# Patient Record
Sex: Female | Born: 2000
Health system: Southern US, Community
[De-identification: ages and names within clinical notes are randomized; demographics above are authoritative.]

## PROBLEM LIST (undated history)

## (undated) DIAGNOSIS — L309 Dermatitis, unspecified: Secondary | ICD-10-CM

## (undated) DIAGNOSIS — F988 Other specified behavioral and emotional disorders with onset usually occurring in childhood and adolescence: Secondary | ICD-10-CM

## (undated) DIAGNOSIS — Z91018 Allergy to other foods: Secondary | ICD-10-CM

## (undated) DIAGNOSIS — H1010 Acute atopic conjunctivitis, unspecified eye: Secondary | ICD-10-CM

## (undated) DIAGNOSIS — J45909 Unspecified asthma, uncomplicated: Secondary | ICD-10-CM

## (undated) DIAGNOSIS — J309 Allergic rhinitis, unspecified: Secondary | ICD-10-CM

## (undated) HISTORY — DX: Other specified behavioral and emotional disorders with onset usually occurring in childhood and adolescence: F98.8

## (undated) HISTORY — DX: Acute atopic conjunctivitis, unspecified eye: H10.10

## (undated) HISTORY — PX: NO PAST SURGERIES: SHX2092

## (undated) HISTORY — DX: Dermatitis, unspecified: L30.9

## (undated) HISTORY — DX: Allergy to other foods: Z91.018

## (undated) HISTORY — DX: Acute atopic conjunctivitis, unspecified eye: J30.9

---
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
## 2018-01-15 ENCOUNTER — Other Ambulatory Visit: Payer: Self-pay | Admitting: Physician Assistant

## 2018-03-20 ENCOUNTER — Other Ambulatory Visit: Payer: Self-pay | Admitting: Physician Assistant

## 2018-03-20 NOTE — Telephone Encounter (Signed)
Last seen 11/15/17

## 2018-03-24 ENCOUNTER — Emergency Department (HOSPITAL_COMMUNITY)
Admission: EM | Admit: 2018-03-24 | Discharge: 2018-03-24 | Disposition: A | Discharge status: Home / Self Care | Payer: No Typology Code available for payment source | Attending: Emergency Medicine | Admitting: Emergency Medicine

## 2018-03-24 ENCOUNTER — Encounter (HOSPITAL_COMMUNITY): Payer: Self-pay | Admitting: Emergency Medicine

## 2018-03-24 DIAGNOSIS — R21 Rash and other nonspecific skin eruption: Secondary | ICD-10-CM | POA: Insufficient documentation

## 2018-03-24 DIAGNOSIS — J45909 Unspecified asthma, uncomplicated: Secondary | ICD-10-CM | POA: Diagnosis not present

## 2018-03-24 DIAGNOSIS — Z91010 Allergy to peanuts: Secondary | ICD-10-CM | POA: Insufficient documentation

## 2018-03-24 DIAGNOSIS — Z79899 Other long term (current) drug therapy: Secondary | ICD-10-CM | POA: Insufficient documentation

## 2018-03-24 LAB — COMPREHENSIVE METABOLIC PANEL
ALT: 19 U/L (ref 0–44)
AST: 24 U/L (ref 15–41)
Albumin: 3.8 g/dL (ref 3.5–5.0)
Alkaline Phosphatase: 67 U/L (ref 38–126)
Anion gap: 10 (ref 5–15)
BUN: 8 mg/dL (ref 6–20)
CO2: 22 mmol/L (ref 22–32)
Calcium: 9.3 mg/dL (ref 8.9–10.3)
Chloride: 108 mmol/L (ref 98–111)
Creatinine, Ser: 0.7 mg/dL (ref 0.44–1.00)
GFR calc Af Amer: >60 mL/min (ref >60)
GFR calc non Af Amer: >60 mL/min (ref >60)
Glucose, Bld: 124 mg/dL — ABNORMAL HIGH (ref 70–99)
Potassium: 3.7 mmol/L (ref 3.5–5.1)
Sodium: 140 mmol/L (ref 135–145)
Total Bilirubin: 0.3 mg/dL (ref 0.3–1.2)
Total Protein: 6.8 g/dL (ref 6.5–8.1)

## 2018-03-24 LAB — CBC
HCT: 42.1 % (ref 36.0–46.0)
Hemoglobin: 13.4 g/dL (ref 12.0–15.0)
MCH: 29.1 pg (ref 26.0–34.0)
MCHC: 31.8 g/dL (ref 30.0–36.0)
MCV: 91.5 fL (ref 78.0–100.0)
Platelets: 396 10*3/uL (ref 150–400)
RBC: 4.6 MIL/uL (ref 3.87–5.11)
RDW: 13.1 % (ref 11.5–15.5)
WBC: 11.7 10*3/uL — ABNORMAL HIGH (ref 4.0–10.5)

## 2018-03-24 LAB — I-STAT CG4 LACTIC ACID, ED
Lactic Acid, Venous: 3.1 mmol/L (ref 0.5–1.9)
Lactic Acid, Venous: 1.81 mmol/L (ref 0.5–1.9)

## 2018-03-24 MED ORDER — PREDNISONE 20 MG PO TABS: 60.0000 mg | ORAL_TABLET | Freq: Every day | ORAL | 0 refills | Status: DC

## 2018-03-24 MED ORDER — PREDNISONE 20 MG PO TABS: 60.0000 mg | ORAL_TABLET | Freq: Every day | ORAL | 0 refills | Status: AC

## 2018-03-24 MED ORDER — DIPHENHYDRAMINE HCL 25 MG PO TABS: 25.0000 mg | ORAL_TABLET | Freq: Three times a day (TID) | ORAL | 0 refills | Status: DC | PRN

## 2018-03-24 MED ORDER — FAMOTIDINE IN NACL 20-0.9 MG/50ML-% IV SOLN
20.0000 mg | Freq: Once | INTRAVENOUS | Status: AC
  Administered 2018-03-24: 20 mg via INTRAVENOUS
  Filled 2018-03-24: qty 50

## 2018-03-24 MED ORDER — FAMOTIDINE 20 MG PO TABS: 20.0000 mg | ORAL_TABLET | Freq: Two times a day (BID) | ORAL | 0 refills | Status: DC

## 2018-03-24 MED ORDER — METHYLPREDNISOLONE SODIUM SUCC 125 MG IJ SOLR
125.0000 mg | Freq: Once | INTRAMUSCULAR | Status: AC
  Administered 2018-03-24: 125 mg via INTRAVENOUS
  Filled 2018-03-24: qty 2

## 2018-03-24 MED ORDER — SODIUM CHLORIDE 0.9 % IV BOLUS
1000.0000 mL | Freq: Once | INTRAVENOUS | Status: AC
Start: 2018-03-24 — End: 2018-03-24
  Administered 2018-03-24: 1000 mL via INTRAVENOUS

## 2018-03-24 MED ORDER — DIPHENHYDRAMINE HCL 50 MG/ML IJ SOLN
50.0000 mg | Freq: Once | INTRAMUSCULAR | Status: AC
  Administered 2018-03-24: 50 mg via INTRAVENOUS
  Filled 2018-03-24: qty 1

## 2018-03-24 NOTE — Discharge Instructions (Addendum)
Take Prednisone 60mg  daily for 7 days. Also take the Benadryl and Pepcid three times a day while you are on the steroid.   Follow-up with your allergist and/or PCP ASAP.

## 2018-03-24 NOTE — ED Notes (Signed)
Pt made this RN aware [facility]  Will order blood.

## 2018-03-24 NOTE — ED Provider Notes (Signed)
MSE was initiated and I personally evaluated the patient and placed orders (if any) at  6:17 PM on March 24, 2018.  The patient appears stable so that the remainder of the MSE may be completed by another provider.  Patient placed in Quick Look pathway, seen and evaluated   Chief Complaint: rash  HPI:   Patient presents to ED for evaluation of 1-1/2-week history of worsening diffuse rash to body.  She went to the beach for vacation before symptoms began.  States that she laid out in the sun but does not feel that this is related to sunburn.  The only thing different that she can think of that she may be exposed to was ocean water.  She does not usually go into the ocean.  She has a history of eczema but states that has never been this bad or diffuse.  States that the rash is extremely itchy.  She denies fevers.  No sick contacts with similar symptoms.  She is not taking any medicine help with her symptoms.  ROS: Rash  Physical Exam:   Gen: No distress  Neuro: Awake and Alert  Skin: Warm    Focused Exam: Diffuse erythematous, scaly and excoriated rash noted to bilateral upper and lower extremities.  Tachycardic.   Initiation of care has begun. The patient has been counseled on the process, plan, and necessity for staying for the completion/evaluation, and the remainder of the medical screening examination    Dietrich PatesKhatri, Jurnei Latini, PA-C 03/24/18 1819    Gerhard MunchLockwood, Robert, MD 03/25/18 1327

## 2018-03-24 NOTE — ED Provider Notes (Addendum)
[facility] EMERGENCY DEPARTMENT Provider Note  CSN: [id number] Arrival date & time: 03/24/18  1626  History   Chief Complaint Chief Complaint  Patient presents with  . Rash    HPI Casey Weaver is a 17 y.o. female with a medical history of numerous allergies, asthma and eczema who presented to the ED for rash x1.5 weeks. Patient describes a generalized, red, itchy and flaky rash that began while she was on vacation at the beach. She endorses getting in the ocean for the first time and that the rash appeared the next day. Rash is everywhere except her genitalia. She reports using Kenalog cream for the rash with minimal relief. Denies fever, chest pain, SOB, wheezing, difficulty breathing and upper respiratory symptoms. Denies recent sick contacts. She is up to date with vaccines. Denies neck pain, stiffness, AMS/confusion.  Additional history obtained by patient's mother. She states that patient has over 70 allergies and is hypersensitive to exposures. She reports patient being managed by an allergist and is on multiple creams, inhalants and pills for her allergies, asthmas and eczema.  Past Medical History:  Diagnosis Date  . Allergic rhinoconjunctivitis   . Asthma   . Attention deficit disorder (ADD)   . Eczema   . Food allergy     Patient Active Problem List   Diagnosis Date Noted  . Gastroesophageal reflux disease without esophagitis 06/16/2017  . Eczema 11/06/2016  . Chronic asthma 06/30/2016  . Intrinsic eczema 06/30/2016  . Attention deficit hyperactivity disorder (ADHD), predominantly inattentive type 06/30/2016    Past Surgical History:  Procedure Laterality Date  . NO PAST SURGERIES       OB History   None      Home Medications    Prior to Admission medications   Medication Sig Start Date End Date Taking? Authorizing Provider  albuterol (PROVENTIL) (2.5 MG/3ML) 0.083% nebulizer solution INHALE 1 VIAL VIA NEBULIZER EVERY 6 HOURS 10/04/17  Yes  Remus Loffler, PA-C  budesonide-formoterol (SYMBICORT) 160-4.5 MCG/ACT inhaler Inhale 2 puffs into the lungs 2 (two) times daily. 11/15/17  Yes Remus Loffler, PA-C  ibuprofen (ADVIL,MOTRIN) 200 MG tablet Take 200 mg by mouth every 6 (six) hours as needed for pain.   Yes [provider]  levocetirizine (XYZAL) 5 MG tablet TAKE 1 TABLET BY MOUTH ONCE EVERY EVENING 03/20/18  Yes Prudy Feeler S, PA-C  montelukast (SINGULAIR) 10 MG tablet TAKE 1 TABLET EVERY EVENING 03/20/18  Yes Remus Loffler, PA-C  VENTOLIN HFA 108 (90 Base) MCG/ACT inhaler INHALE 2 PUFFS EVERY 4 TO 6 HOURS AS NEEDED FOR COUGH AND WHEEZING 01/15/18  Yes Remus Loffler, PA-C  ALPRAZolam Prudy Feeler) 0.5 MG tablet Take 1 tablet (0.5 mg total) by mouth at bedtime as needed for anxiety. Patient not taking: Reported on 03/24/2018 12/21/17   Remus Loffler, PA-C  cyclobenzaprine (FLEXERIL) 10 MG tablet Take 1 tablet (10 mg total) by mouth 2 (two) times daily as needed for muscle spasms. Patient not taking: Reported on 03/24/2018 11/14/17   Maxwell Caul, PA-C  diphenhydrAMINE (BENADRYL) 25 MG tablet Take 1 tablet (25 mg total) by mouth every 8 (eight) hours as needed for up to 7 days for itching. 03/24/18 03/31/18  Mortis, Jerrel Ivory I, PA-C  EUCRISA 2 % OINT APPLY TOPICALLY TWICE A DAY Patient not taking: Reported on 03/24/2018 10/01/17   Remus Loffler, PA-C  famotidine (PEPCID) 20 MG tablet Take 1 tablet (20 mg total) by mouth 2 (two) times daily  for 7 days. 03/24/18 03/31/18  Mortis, Jerrel IvoryGabrielle I, PA-C  halobetasol (ULTRAVATE) 0.05 % cream Apply topically 2 (two) times daily. Patient not taking: Reported on 03/24/2018 05/29/16   Remus LofflerJones, Angel S, PA-C  omeprazole (PRILOSEC) 20 MG capsule Take 1 capsule (20 mg total) by mouth daily. Patient not taking: Reported on 03/24/2018 11/15/17   Remus LofflerJones, Angel S, PA-C  pimecrolimus (ELIDEL) 1 % cream Apply topically 2 (two) times daily. Patient not taking: Reported on 03/24/2018 05/01/17   Remus LofflerJones, Angel S, PA-C    predniSONE (DELTASONE) 20 MG tablet Take 3 tablets (60 mg total) by mouth daily for 7 doses. 03/24/18 03/31/18  Mortis, Jerrel IvoryGabrielle I, PA-C  triamcinolone cream (KENALOG) 0.1 % APPLY TO AFFECTED AREA TWICE A DAY AS DIRECTED Patient not taking: Reported on 03/24/2018 02/15/17   Remus LofflerJones, Angel S, PA-C    Family History Family History  Problem Relation Age of Onset  . Eczema Mother   . Allergic rhinitis Mother   . Angioedema Neg Hx   . Asthma Neg Hx   . Immunodeficiency Neg Hx   . Urticaria Neg Hx     Social History Social History   Tobacco Use  . Smoking status: Never Smoker  . Smokeless tobacco: Never Used  Substance Use Topics  . Alcohol use: No  . Drug use: No     Allergies   Peanut-containing drug products and Septra [sulfamethoxazole-trimethoprim]   Review of Systems Review of Systems  Constitutional: Negative for activity change, appetite change, chills and fever.  HENT: Negative for congestion, ear pain, postnasal drip, rhinorrhea and sore throat.   Eyes: Negative for pain and visual disturbance.  Respiratory: Negative for cough, choking, chest tightness, shortness of breath and wheezing.   Cardiovascular: Negative for chest pain, palpitations and leg swelling.  Gastrointestinal: Negative.   Genitourinary: Negative.   Musculoskeletal: Negative.   Skin: Positive for rash.  Allergic/Immunologic: Positive for environmental allergies and food allergies.     Physical Exam Updated Vital Signs BP 108/65   Pulse (!) 101   Temp 98.6 F (37 C) (Oral)   Resp (!) 21   LMP 03/20/2018   SpO2 98%   Physical Exam  Constitutional: She appears well-developed and well-nourished.  Obese. Laying comfortably on bed.  Eyes: Pupils are equal, round, and reactive to light. Conjunctivae, EOM and lids are normal.  Neck: Normal range of motion and full passive range of motion without pain. Neck supple. No neck rigidity. Normal range of motion present. No Brudzinski's sign and no  Kernig's sign noted.  Cardiovascular: Normal rate, regular rhythm, normal heart sounds and intact distal pulses.  Pulses:      Dorsalis pedis pulses are 2+ on the right side, and 2+ on the left side.       Posterior tibial pulses are 2+ on the right side, and 2+ on the left side.  Pulmonary/Chest: Effort normal and breath sounds normal. No respiratory distress. She has no wheezes.  Abdominal: Soft. Normal appearance and bowel sounds are normal. There is no tenderness.  Musculoskeletal: Normal range of motion.  Neurological: She has normal strength. No cranial nerve deficit or sensory deficit. She exhibits normal muscle tone.  Skin: Skin is dry. Capillary refill takes less than 2 seconds. Rash noted. Rash is macular. She is not diaphoretic. There is erythema. No pallor.  Generalized erythematous fine macular rash with scaling on bilateral upper and lower extremities, trunk, back and face. Spares buttocks and genitalia. Eczematous plaques with excoriations on dorsal ankles and  antecubital creases bilaterally. No vesicles, blisters, ulcers or areas desquamation. No active draining or bleeding sites.  Nursing note and vitals reviewed.         ED Treatments / Results  Labs (all labs ordered are listed, but only abnormal results are displayed) Labs Reviewed  CBC - Abnormal; Notable for the following components:      Result Value   WBC 11.7 (*)    All other components within normal limits  COMPREHENSIVE METABOLIC PANEL - Abnormal; Notable for the following components:   Glucose, Bld 124 (*)    All other components within normal limits  I-STAT CG4 LACTIC ACID, ED - Abnormal; Notable for the following components:   Lactic Acid, Venous 3.10 (*)    All other components within normal limits  GROUP A STREP BY PCR  CULTURE, BLOOD (ROUTINE X 2)  CULTURE, BLOOD (ROUTINE X 2)  URINALYSIS, ROUTINE W REFLEX MICROSCOPIC  I-STAT CG4 LACTIC ACID, ED   EKG None  Radiology No results  found.  Procedures Procedures (including critical care time)  Medications Ordered in ED Medications  sodium chloride 0.9 % bolus 1,000 mL (0 mLs Intravenous Stopped 03/24/18 2007)  methylPREDNISolone sodium succinate (SOLU-MEDROL) 125 mg/2 mL injection 125 mg (125 mg Intravenous Given 03/24/18 1935)  diphenhydrAMINE (BENADRYL) injection 50 mg (50 mg Intravenous Given 03/24/18 1935)  famotidine (PEPCID) IVPB 20 mg premix (0 mg Intravenous Stopped 03/24/18 2007)     Initial Impression / Assessment and Plan / ED Course  Triage vital signs and the nursing notes have been reviewed.  Pertinent labs & imaging results that were available during care of the patient were reviewed and considered in medical decision making (see chart for details).  Patient presents with normal vital signs and is relatively well appearing. A generalized head to toe erythematous macular scaly rash is appreciated. Patient is not scratching on exam. The rash is uniform and consistent throughout the body. It is reassuring that there are no vesicles, blisters, ulcers or areas of desquamation to suggest SJS or TEN. Also, given that rash has been stable for over a week without any constitutional symptoms is also reassuring to rule out an emergent bacterial skin condition like necrotizing fasciitis. No s/s of anaphylaxis or respiratory compromise.   Clinical Course as of Mar 24 2202  Wynelle Link Mar 24, 2018  1610 Initial lactic acid elevated > 3. Blood cultures ordered. Will repeat level.   [GM]  1908 IV Solumedrol, Benadryl and Pepcid ordered.   [GM]  2201 Repeat lactic acid back to normal at 1.81. Re-evaluated patient after IV therapies. Resolution in pruritus achieved. Also patient's face is less erythematous and rashes appear less swollen. Case discussed with Dr. Gerhard Munch about discharge vs observation. He agrees that patient is stable enough for discharge.   [GM]    Clinical Course User Index [GM] Mortis, Sharyon Medicus, PA-C    Given patient's hypersensitivity and extensive allergy history, it is reasonable to think that rash is just an allergic reaction. Patient re-evaluated after IV medications given. She reports decreased pruritus and face is less erythematous and other areas less swollen as well. Given lactic acid has dropped and has reported compliance with other medications and close follow-up with allergist, patient is stable enough for discharge today. Thorough education provided to patient and family on medications prescribed and follow-up instructions.  Final Clinical Impressions(s) / ED Diagnoses  1. Rash. Hypersensitivity reaction. Unknown allergen at this time. Prednisone 60mg  x7 days, Benadryl 25mg  TID and Pepcid 20mg   BID prescribed to assist with rash resolution and symptom relief. Advised to follow-up with allergist ASAP.  Dispo: Home. After thorough clinical evaluation, this patient is determined to be medically stable and can be safely discharged with the previously mentioned treatment and/or outpatient follow-up/referral(s). At this time, there are no other apparent medical conditions that require further screening, evaluation or treatment.  Final diagnoses:  Rash   ED Discharge Orders        Ordered     03/24/18 2100    diphenhydrAMINE (BENADRYL) 25 MG tablet  Every 8 hours PRN     03/24/18 2100       03/24/18 2102    predniSONE (DELTASONE) 20 MG tablet  Daily     03/24/18 2102        Dagoberto Ligas I, PA-C 03/24/18 2200    Dagoberto Ligas I, PA-C 03/24/18 2205    Gerhard Munch, MD 03/25/18 1326

## 2018-03-24 NOTE — ED Triage Notes (Signed)
Pt presents to ED for assessment of entire body rash, worsening x 1.5 weeks while patient was on vacation.  Pt c/o of her skin feeling hot, hx of eczema.  States skin is sensitive, painful, and diffuse rash noted.

## 2018-03-25 LAB — GROUP A STREP BY PCR: Group A Strep by PCR: NOT DETECTED

## 2018-03-29 LAB — CULTURE, BLOOD (ROUTINE X 2)
Culture: NO GROWTH
Culture: NO GROWTH
Special Requests: ADEQUATE
Special Requests: ADEQUATE

## 2018-04-22 ENCOUNTER — Ambulatory Visit (INDEPENDENT_AMBULATORY_CARE_PROVIDER_SITE_OTHER): Payer: No Typology Code available for payment source | Admitting: Physician Assistant

## 2018-04-22 ENCOUNTER — Encounter: Payer: Self-pay | Admitting: Physician Assistant

## 2018-04-22 VITALS — BP 140/93 | HR 89 | Temp 97.4°F | Ht 60.02 in | Wt 196.4 lb

## 2018-04-22 DIAGNOSIS — F321 Major depressive disorder, single episode, moderate: Secondary | ICD-10-CM | POA: Insufficient documentation

## 2018-04-22 MED ORDER — ESCITALOPRAM OXALATE 10 MG PO TABS: 10.0000 mg | ORAL_TABLET | Freq: Every day | ORAL | 1 refills | Status: DC

## 2018-04-22 NOTE — Patient Instructions (Signed)
Your provider wants you to schedule an appointment with a Psychologist/Psychiatrist. The following list of offices requires the patient to call and make their own appointment, as there is information they need that only you can provide. Please feel free to choose form the following providers:  [facility]   [phone] Crisis [place] [phone]  [facility]  [phone]   [address] [place], [place]  (Scheduled through [facility]) Must call and do an interview for appointment. Sees Children / Accepts Medicaid  [facility]    [phone]  [address], [address]    [place], [place]       [facility]  [phone] [address] [place], [place]  Evaluates for Autism but does not treat it Sees Children / Accepts Medicaid  [facility]    [phone] [address], [address]   [place], [place] Medication management, substance abuse, bipolar, grief, family, marriage, OCD, anxiety, PTSD Sees children / Accepts Medicaid  [facility]    [phone] [address] [place], [place] Sees children / [facility]s [facility]  [facility]  [phone] [address] [place], [place]   Dr Estelle GrumblesAkinlayo     [phone] [address] [place]  Sees ADD & ADHD for treatment Accepts Medicaid  [facility]  [phone] [place], [place] Evaluates for Autism [facility]s [facility]  [facility]  [phone] [address] [place], [place]  Does Adult ADD evaluations Does not accept Medicaid  [facility]   [phone] [address]   [place], [place] Uses animal therapy  Sees children as young as 19 years old Accepts [facility]  [facility]     [phone]    [address]  [place], [place] [phone] Sees children Accepts Medicaid  JULIA Alvan DameBRANNON
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
## 2018-04-30 ENCOUNTER — Telehealth: Payer: Self-pay | Admitting: Physician Assistant

## 2018-04-30 NOTE — Telephone Encounter (Signed)
What symptoms do you have? Swelling in her legs  How long have you been sick? Has been off in and on since going to ER in July, she is going to the bathroom was told it was reaction since she was on vacation and in different surrounding  Have you been seen for this problem? Yes, when she elevates her legs the swelling goes down  If your provider decides to give you a prescription, which pharmacy would you like for it to be sent to? [facility]   Patient informed that this information will be sent to the clinical staff for review and that they should receive a follow up call.

## 2018-05-01 ENCOUNTER — Encounter: Payer: Self-pay | Admitting: Family Medicine

## 2018-05-01 ENCOUNTER — Ambulatory Visit (INDEPENDENT_AMBULATORY_CARE_PROVIDER_SITE_OTHER): Payer: No Typology Code available for payment source | Admitting: Family Medicine

## 2018-05-01 VITALS — BP 159/76 | HR 97 | Temp 98.1°F | Ht 60.02 in | Wt 200.2 lb

## 2018-05-01 DIAGNOSIS — L2084 Intrinsic (allergic) eczema: Secondary | ICD-10-CM

## 2018-05-01 MED ORDER — METHYLPREDNISOLONE ACETATE 80 MG/ML IJ SUSP
80.0000 mg | Freq: Once | INTRAMUSCULAR | Status: AC
  Administered 2018-05-01: 80 mg via INTRAMUSCULAR

## 2018-05-01 MED ORDER — TRIAMCINOLONE ACETONIDE 0.1 % EX CREA: TOPICAL_CREAM | Freq: Two times a day (BID) | CUTANEOUS | 2 refills | Status: DC

## 2018-05-01 MED ORDER — PREDNISONE 20 MG PO TABS: ORAL_TABLET | ORAL | 0 refills | Status: DC

## 2018-05-01 NOTE — Telephone Encounter (Signed)
Apt today.

## 2018-05-01 NOTE — Progress Notes (Signed)
BP (!) 159/76   Pulse 97   Temp 98.1 F (36.7 C) (Oral)   Ht 5' 0.02" (1.525 m)   Wt 200 lb 3.2 oz (90.8 kg)   BMI 39.07 kg/m    Subjective:    Patient ID: Casey Weaver, female    DOB: 2001/04/04, 17 y.o.   MRN: [id number]  HPI: Casey Pedlesa C Nuckles is a 17 y.o. female presenting on 05/01/2018 for Leg Swelling (bilateral x 1 month on and off- Patient was seen [facility] ER 7/7) and Arm Swelling (bilateral on and off)   HPI Swelling and rash Patient comes in complaining of swelling and rash is been going on for 1 month off and on.  She was seen in the ER and having 03/2018 and they saw that she had a severe eczema and rash and gave her some steroids which almost completely resolved what she had but then it came back about a week later it is been coming on and off.  She is continued to use her allergy medications including Singulair and Xyzal and triamcinolone and CeraVe cream.  She says that these are just not cutting it for her.  She says this all started [place].  There is no sign of redness or warmth but just the rash looks like her usual eczema on both of her arms and some on her face and neck and some on her legs and feet as well.  She has swelling and redness in her arms and legs where the rash is.  Relevant past medical, surgical, family and social history reviewed and updated as indicated. Interim medical history since our last visit reviewed. Allergies and medications reviewed and updated.  Review of Systems  Constitutional: Negative for chills and fever.  Eyes: Negative for visual disturbance.  Respiratory: Negative for cough, chest tightness, shortness of breath and wheezing.   Cardiovascular: Positive for leg swelling. Negative for chest pain.  Genitourinary: Negative for difficulty urinating and dysuria.  Musculoskeletal: Negative for back pain and gait problem.  Skin: Positive for color change and rash.  Neurological: Negative for light-headedness and  headaches.  Psychiatric/Behavioral: Negative for agitation and behavioral problems.  All other systems reviewed and are negative.   Per HPI unless specifically indicated above   Allergies as of 05/01/2018      Reactions   Peanut-containing Drug Products Swelling   PEANUT BUTTER: throat swells   Septra [sulfamethoxazole-trimethoprim] Rash      Medication List        Accurate as of 05/01/18  2:07 PM. Always use your most recent med list.          albuterol (2.5 MG/3ML) 0.083% nebulizer solution Commonly known as:  PROVENTIL INHALE 1 VIAL VIA NEBULIZER EVERY 6 HOURS   VENTOLIN HFA 108 (90 Base) MCG/ACT inhaler Generic drug:  albuterol INHALE 2 PUFFS EVERY 4 TO 6 HOURS AS NEEDED FOR COUGH AND WHEEZING   ALPRAZolam 0.5 MG tablet Commonly known as:  XANAX Take 1 tablet (0.5 mg total) by mouth at bedtime as needed for anxiety.   budesonide-formoterol 160-4.5 MCG/ACT inhaler Commonly known as:  SYMBICORT Inhale 2 puffs into the lungs 2 (two) times daily.   cyclobenzaprine 10 MG tablet Commonly known as:  FLEXERIL Take 1 tablet (10 mg total) by mouth 2 (two) times daily as needed for muscle spasms.   diphenhydrAMINE 25 MG tablet Commonly known as:  BENADRYL Take 1 tablet (25 mg total) by mouth every 8 (  eight) hours as needed for up to 7 days for itching.   escitalopram 10 MG tablet Commonly known as:  LEXAPRO Take 1 tablet (10 mg total) by mouth daily.   EUCRISA 2 % Oint Generic drug:  Crisaborole APPLY TOPICALLY TWICE A DAY   famotidine 20 MG tablet Commonly known as:  PEPCID Take 1 tablet (20 mg total) by mouth 2 (two) times daily for 7 days.   halobetasol 0.05 % cream Commonly known as:  ULTRAVATE Apply topically 2 (two) times daily.   ibuprofen 200 MG tablet Commonly known as:  ADVIL,MOTRIN Take 200 mg by mouth every 6 (six) hours as needed for pain.   levocetirizine 5 MG tablet Commonly known as:  XYZAL TAKE 1 TABLET BY MOUTH ONCE EVERY EVENING     montelukast 10 MG tablet Commonly known as:  SINGULAIR TAKE 1 TABLET EVERY EVENING   omeprazole 20 MG capsule Commonly known as:  PRILOSEC Take 1 capsule (20 mg total) by mouth daily.   pimecrolimus 1 % cream Commonly known as:  ELIDEL Apply topically 2 (two) times daily.   predniSONE 20 MG tablet Commonly known as:  DELTASONE 2 po at same time daily for 5 days   triamcinolone cream 0.1 % Commonly known as:  KENALOG Apply topically 2 (two) times daily.          Objective:    BP (!) 159/76   Pulse 97   Temp 98.1 F (36.7 C) (Oral)   Ht 5' 0.02" (1.525 m)   Wt 200 lb 3.2 oz (90.8 kg)   BMI 39.07 kg/m   Wt Readings from Last 3 Encounters:  05/01/18 200 lb 3.2 oz (90.8 kg) (97 %, Z= 1.95)*  04/22/18 196 lb 6.4 oz (89.1 kg) (97 %, Z= 1.90)*  11/15/17 197 lb 9.6 oz (89.6 kg) (97 %, Z= 1.93)*   * Growth percentiles are based on CDC (Girls, 2-20 Years) data.    Physical Exam  Constitutional: She is oriented to person, place, and time. She appears well-developed and well-nourished. No distress.  Eyes: Conjunctivae are normal.  Musculoskeletal: Normal range of motion. She exhibits no edema.  Neurological: She is alert and oriented to person, place, and time. Coordination normal.  Skin: Skin is warm and dry. Rash noted. Rash is maculopapular (Maculopapular rash with excoriations on both upper arms and on her lower legs, none on her palms or soles of her feet or in her groin.  She has some around her neck and a little bit on her face above her eye and on her cheeks.). She is not diaphoretic.  Psychiatric: She has a normal mood and affect. Her behavior is normal.  Nursing note and vitals reviewed.  She has swelling in her arms from the inflammation from the rash and slight swelling in her legs from the same.     Assessment & Plan:   Problem List Items Addressed This Visit      Musculoskeletal and Integument   Intrinsic eczema - Primary   Relevant Medications    triamcinolone cream (KENALOG) 0.1 %   predniSONE (DELTASONE) 20 MG tablet   methylPREDNISolone acetate (DEPO-MEDROL) injection 80 mg (Start on 05/01/2018  2:15 PM)      Give more triamcinolone, recommended wet-to-dry clothing at night and cool showers and another round of prednisone with double shot. Follow up plan: Return if symptoms worsen or fail to improve.  Counseling provided for all of the vaccine components No orders of the defined types were placed in  this encounter.   Arville Care, MD [facility] 05/01/2018, 2:07 PM

## 2018-05-01 NOTE — Telephone Encounter (Signed)
Attempted to contact - NA ° °NTBS °

## 2018-05-07 ENCOUNTER — Encounter: Payer: Self-pay | Admitting: Allergy and Immunology

## 2018-05-07 ENCOUNTER — Telehealth: Payer: Self-pay | Admitting: *Deleted

## 2018-05-07 ENCOUNTER — Ambulatory Visit (INDEPENDENT_AMBULATORY_CARE_PROVIDER_SITE_OTHER): Payer: No Typology Code available for payment source | Admitting: Allergy and Immunology

## 2018-05-07 VITALS — BP 116/60 | HR 88 | Resp 16 | Ht 60.0 in | Wt 199.0 lb

## 2018-05-07 DIAGNOSIS — D721 Eosinophilia, unspecified: Secondary | ICD-10-CM

## 2018-05-07 DIAGNOSIS — J309 Allergic rhinitis, unspecified: Secondary | ICD-10-CM | POA: Diagnosis not present

## 2018-05-07 DIAGNOSIS — T7800XD Anaphylactic reaction due to unspecified food, subsequent encounter: Secondary | ICD-10-CM | POA: Diagnosis not present

## 2018-05-07 DIAGNOSIS — J4540 Moderate persistent asthma, uncomplicated: Secondary | ICD-10-CM

## 2018-05-07 DIAGNOSIS — L2089 Other atopic dermatitis: Secondary | ICD-10-CM | POA: Diagnosis not present

## 2018-05-07 DIAGNOSIS — H1010 Acute atopic conjunctivitis, unspecified eye: Secondary | ICD-10-CM

## 2018-05-07 MED ORDER — EPINEPHRINE 0.3 MG/0.3ML IJ SOAJ: INTRAMUSCULAR | 1 refills | Status: DC

## 2018-05-07 MED ORDER — AMOXICILLIN-POT CLAVULANATE 875-125 MG PO TABS: 1.0000 | ORAL_TABLET | Freq: Two times a day (BID) | ORAL | 0 refills | Status: AC

## 2018-05-07 MED ORDER — TACROLIMUS 0.1 % EX OINT: TOPICAL_OINTMENT | Freq: Two times a day (BID) | CUTANEOUS | 2 refills | Status: DC

## 2018-05-07 NOTE — Telephone Encounter (Signed)
Patient started Dupixent today please submit per Dr Lucie LeatherKozlow

## 2018-05-07 NOTE — Patient Instructions (Addendum)
  1.  Treat and prevent skin inflammation:   A.  Triamcinolone 0.1% cream + Protopic 0.1% ointment twice a day  B.  Finish current dosing of prednisone  C.  Dupilumab administered in clinic today and to be utilized every 2 weeks  2.  Treat and prevent respiratory inflammation:   A.  Symbicort 160 - 2 inhalations 1-2 times per day  3.  Treat infection:   A.  Augmentin 875 - 1 tablet twice a day for 10 days  4.  If needed:   A.  EpiPen/Auvi-Q  B.  OTC antihistamine -Zyrtec/Claritin/Allegra  C.  Ventolin HFA  5.  Return to clinic in 2 weeks or earlier if problem

## 2018-05-07 NOTE — Progress Notes (Addendum)
Immunotherapy   Patient Details  Name: Casey Weaver MRN: [id number] Date of Birth: Feb 24, 2001  05/07/2018  Casey Weaver started injections for  Dupixent 600mg   Frequency:Every 2 weeks Epi-Pen:Presciption for [name] Q sent in  Consent signed and patient instructions given. Mother and patient instructed on how and where to administer shot mother demonstrated correct technique and had no difficulties  [name] 05/07/2018, 6:05 PM

## 2018-05-07 NOTE — Progress Notes (Signed)
Follow-up Note  Referring Provider: Remus LofflerJones, Angel S, PA-C Primary Provider: Caryl NeverJones, Angel S, PA-C Date of Office Visit: 05/07/2018  Subjective:   Casey Weaver (DOB: Sep 28, 2000) is a 17 y.o. female [facility] in re-evaluation of the following:  HPI: Casey Weaver presents to this clinic in evaluation of multiorgan atopic disease.  I have never seen her in this clinic.  Her last visit with our clinic was with Dr. Delorse LekPadgett for an initial evaluation of 06 September 2016.  She has very severe atopic dermatitis and has failed both the application of topical steroids including high potency topical steroids and the application of calcineurin inhibitors.  Most recently she has had 2 very severe flares of her atopic dermatitis one in July and one in August both requiring systemic steroids.  She developed very red skin with localized swelling and then oozing involving her antecubital fossa and popliteal fossa and lower extremity.  She is much better at this point in time on her second course of systemic steroids but still has very active eczema on a chronic basis.  She is just finishing up her course of systemic steroids for the next several days.  Her asthma is under pretty good control at this point time while using Symbicort just 1 time per day.  Her requirement for short acting bronchodilators 1 time per week.  She does not appear to have a significant problem with her upper airways.  She remains away from peanut and tree nuts at this point in time.  Allergies as of 05/07/2018      Reactions   Peanut-containing Drug Products Swelling   PEANUT BUTTER: throat swells   Septra [sulfamethoxazole-trimethoprim] Rash      Medication List      albuterol (2.5 MG/3ML) 0.083% nebulizer solution Commonly known as:  PROVENTIL INHALE 1 VIAL VIA NEBULIZER EVERY 6 HOURS   VENTOLIN HFA 108 (90 Base) MCG/ACT inhaler Generic drug:  albuterol INHALE 2 PUFFS EVERY 4 TO  6 HOURS AS NEEDED FOR COUGH AND WHEEZING   ALPRAZolam 0.5 MG tablet Commonly known as:  XANAX Take 1 tablet (0.5 mg total) by mouth at bedtime as needed for anxiety.   budesonide-formoterol 160-4.5 MCG/ACT inhaler Commonly known as:  SYMBICORT Inhale 2 puffs into the lungs 2 (two) times daily.   escitalopram 10 MG tablet Commonly known as:  LEXAPRO Take 1 tablet (10 mg total) by mouth daily.   EUCRISA 2 % Oint Generic drug:  Crisaborole APPLY TOPICALLY TWICE A DAY   famotidine 20 MG tablet Commonly known as:  PEPCID Take 1 tablet (20 mg total) by mouth 2 (two) times daily for 7 days.   halobetasol 0.05 % cream Commonly known as:  ULTRAVATE Apply topically 2 (two) times daily.   ibuprofen 200 MG tablet Commonly known as:  ADVIL,MOTRIN Take 200 mg by mouth every 6 (six) hours as needed for pain.   levocetirizine 5 MG tablet Commonly known as:  XYZAL TAKE 1 TABLET BY MOUTH ONCE EVERY EVENING   montelukast 10 MG tablet Commonly known as:  SINGULAIR TAKE 1 TABLET EVERY EVENING   omeprazole 20 MG capsule Commonly known as:  PRILOSEC Take 1 capsule (20 mg total) by mouth daily.   pimecrolimus 1 % cream Commonly known as:  ELIDEL Apply topically 2 (two) times daily.   predniSONE 20 MG tablet Commonly known as:  DELTASONE 2 po at same time daily for 5 days   triamcinolone cream  0.1 % Commonly known as:  KENALOG Apply topically 2 (two) times daily.       Past Medical History:  Diagnosis Date  . Allergic rhinoconjunctivitis   . Asthma   . Attention deficit disorder (ADD)   . Eczema   . Food allergy     Past Surgical History:  Procedure Laterality Date  . NO PAST SURGERIES      Review of systems negative except as noted in HPI / PMHx or noted below:  Review of Systems  Constitutional: Negative.   HENT: Negative.   Eyes: Negative.   Respiratory: Negative.   Cardiovascular: Negative.   Gastrointestinal: Negative.   Genitourinary: Negative.     Musculoskeletal: Negative.   Skin: Negative.   Neurological: Negative.   Endo/Heme/Allergies: Negative.   Psychiatric/Behavioral: Negative.      Objective:   Vitals:   05/07/18 1642  BP: 116/60  Pulse: 88  Resp: 16  SpO2: 97%   Height: 5' (152.4 cm)  Weight: 199 lb (90.3 kg)   Physical Exam  HENT:  Head: Normocephalic.  Right Ear: Tympanic membrane, external ear and ear canal normal.  Left Ear: Tympanic membrane, external ear and ear canal normal.  Nose: Nose normal. No mucosal edema or rhinorrhea.  Mouth/Throat: Uvula is midline, oropharynx is clear and moist and mucous membranes are normal. No oropharyngeal exudate.  Eyes: Conjunctivae are normal.  Neck: Trachea normal. No tracheal tenderness present. No tracheal deviation present. No thyromegaly present.  Cardiovascular: Normal rate, regular rhythm, S1 normal, S2 normal and normal heart sounds.  No murmur heard. Pulmonary/Chest: Breath sounds normal. No stridor. No respiratory distress. She has no wheezes. She has no rales.  Musculoskeletal: She exhibits no edema.  Lymphadenopathy:       Head (right side): No tonsillar adenopathy present.       Head (left side): No tonsillar adenopathy present.    She has no cervical adenopathy.  Neurological: She is alert.  Skin: Rash (Widespread lichenified erythematous plaques across arms especially antecubital fossa and legs especially popliteal fossa and posterior calf) noted. She is not diaphoretic. No erythema. Nails show no clubbing.    Diagnostics:    Spirometry was performed and demonstrated an FEV1 of 3.19 at 108 % of predicted.  The patient had an Asthma Control Test with the following results: ACT Total Score: 20.    Results of blood tests obtained 15 November 2017 identified WBC 8.6, absolute eosinophil 1000, absolute lymphocyte 1700, hemoglobin 12.9, platelet 349  Assessment and Plan:   1. Other atopic dermatitis   2. Asthma, moderate persistent, well-controlled    3. Allergic rhinoconjunctivitis   4. Anaphylactic shock due to food, subsequent encounter   5. Eosinophilia     1.  Treat and prevent skin inflammation:   A.  Triamcinolone 0.1% cream + Protopic 0.1% ointment twice a day  B.  Finish current dosing of prednisone  C.  Dupilumab administered in clinic today and to be utilized every 2 weeks  2.  Treat and prevent respiratory inflammation:   A.  Symbicort 160 - 2 inhalations 1-2 times per day  3.  Treat infection:   A.  Augmentin 875 - 1 tablet twice a day for 10 days  4.  If needed:   A.  EpiPen/Auvi-Q  B.  OTC antihistamine -Zyrtec/Claritin/Allegra  C.  Ventolin HFA  5.  Return to clinic in 2 weeks or earlier if problem  Casey Weaver has lost control of her atopic dermatitis the past 2 months and  in reality her chronic control is not that good.  I am going to cover her for staphylococcal skin growth precipitating some of her recent flareups with an antibiotic and we will start her on dupilumab.  She will continue on a large collection of medical therapy as noted above and I will see her back in this clinic in 2 weeks or earlier if there is a problem.  Laurette Schimke, MD Allergy / Immunology [facility]

## 2018-05-08 ENCOUNTER — Encounter: Payer: Self-pay | Admitting: Allergy and Immunology

## 2018-05-08 NOTE — Telephone Encounter (Signed)
Got it did patient fill out paperwork?

## 2018-05-09 NOTE — Telephone Encounter (Signed)
noted 

## 2018-05-09 NOTE — Telephone Encounter (Signed)
Yes we did its in the bulk scanning do you want it faxed?

## 2018-05-09 NOTE — Telephone Encounter (Signed)
No I can look it up

## 2018-05-17 ENCOUNTER — Telehealth: Payer: Self-pay

## 2018-05-17 NOTE — Telephone Encounter (Signed)
Patient called and stated she received Dupixent injections last Tuesday 05/07/2018 and is scheduled to have another injection Tuesday 05/21/2018. Patient is wondering if her medication would be in clinic to get her injection. I informed patient that our biologic coordinator is out this afternoon and would be back on Tuesday. I informed patient I would call Tammy on Tuesday to see if her medication would be in and if not we could reschedule her.

## 2018-05-21 NOTE — Telephone Encounter (Signed)
Called cell phone and mom answered and game me patients number to call her [phone]. I called and spoke with patient and informed her that we were still trying to get her medication approved however we have a sample that we could give her. Patient stated she wasn't able to come in today due to having to work but she may be able to come by Thursday, patient will call and set up an appt when she knows if she is able to come in Thursday.

## 2018-05-21 NOTE — Telephone Encounter (Signed)
Since she has no prescription benefits we had to got through to get free drug but her app is not approved yet.  If you want to call her and have her come in to office for her next injection (one syringe 300mg ) that is fine. Tammy

## 2018-05-23 ENCOUNTER — Ambulatory Visit (INDEPENDENT_AMBULATORY_CARE_PROVIDER_SITE_OTHER): Payer: No Typology Code available for payment source | Admitting: *Deleted

## 2018-05-23 DIAGNOSIS — L2089 Other atopic dermatitis: Secondary | ICD-10-CM

## 2018-05-27 ENCOUNTER — Ambulatory Visit: Payer: No Typology Code available for payment source | Admitting: Physician Assistant

## 2018-06-11 ENCOUNTER — Encounter: Payer: Self-pay | Admitting: Allergy and Immunology

## 2018-06-11 ENCOUNTER — Ambulatory Visit: Payer: No Typology Code available for payment source | Admitting: Allergy and Immunology

## 2018-06-11 VITALS — BP 120/76 | HR 92 | Resp 16

## 2018-06-11 DIAGNOSIS — D721 Eosinophilia, unspecified: Secondary | ICD-10-CM

## 2018-06-11 DIAGNOSIS — L2089 Other atopic dermatitis: Secondary | ICD-10-CM

## 2018-06-11 DIAGNOSIS — J309 Allergic rhinitis, unspecified: Secondary | ICD-10-CM | POA: Diagnosis not present

## 2018-06-11 DIAGNOSIS — H1010 Acute atopic conjunctivitis, unspecified eye: Secondary | ICD-10-CM

## 2018-06-11 DIAGNOSIS — T7800XD Anaphylactic reaction due to unspecified food, subsequent encounter: Secondary | ICD-10-CM

## 2018-06-11 DIAGNOSIS — J4540 Moderate persistent asthma, uncomplicated: Secondary | ICD-10-CM

## 2018-06-11 NOTE — Progress Notes (Signed)
Follow-up Note  Referring Provider: Remus Loffler, PA-C Primary Provider: Caryl Never Date of Office Visit: 06/11/2018  Subjective:   Casey Weaver (DOB: 2001-01-10) is a 17 y.o. female who [facility] in re-evaluation of the following:  HPI: Casey Weaver returns to this clinic in reevaluation of her multiorgan atopic disease including severe atopic dermatitis, allergic rhinoconjunctivitis, asthma, food allergy, and eosinophilia.  Her last visit to this clinic was 07 May 2018 at which point time we started her on dupilumab injections.  She has noticed a dramatic improvement regarding her skin.  She still continues to use triamcinolone and Protopic to areas that are red but her red areas are getting smaller every day.  She has had no issues with her nose and no issues with her chest and does not need to use a short acting bronchodilator.  She remains away from eating peanuts and tree nuts at this point.  Allergies as of 06/11/2018      Reactions   Peanut-containing Drug Products Swelling   PEANUT BUTTER: throat swells   Septra [sulfamethoxazole-trimethoprim] Rash      Medication List      albuterol (2.5 MG/3ML) 0.083% nebulizer solution Commonly known as:  PROVENTIL INHALE 1 VIAL VIA NEBULIZER EVERY 6 HOURS   VENTOLIN HFA 108 (90 Base) MCG/ACT inhaler Generic drug:  albuterol INHALE 2 PUFFS EVERY 4 TO 6 HOURS AS NEEDED FOR COUGH AND WHEEZING   ALPRAZolam 0.5 MG tablet Commonly known as:  XANAX Take 1 tablet (0.5 mg total) by mouth at bedtime as needed for anxiety.   budesonide-formoterol 160-4.5 MCG/ACT inhaler Commonly known as:  SYMBICORT Inhale 2 puffs into the lungs 2 (two) times daily.   EPINEPHrine 0.3 mg/0.3 mL Soaj injection Commonly known as:  EPI-PEN Use as directed for severe allergic reaction   escitalopram 10 MG tablet Commonly known as:  LEXAPRO Take 1 tablet (10 mg total) by mouth daily.   EUCRISA  2 % Oint Generic drug:  Crisaborole APPLY TOPICALLY TWICE A DAY   famotidine 20 MG tablet Commonly known as:  PEPCID Take 1 tablet (20 mg total) by mouth 2 (two) times daily for 7 days.   halobetasol 0.05 % cream Commonly known as:  ULTRAVATE Apply topically 2 (two) times daily.   ibuprofen 200 MG tablet Commonly known as:  ADVIL,MOTRIN Take 200 mg by mouth every 6 (six) hours as needed for pain.   levocetirizine 5 MG tablet Commonly known as:  XYZAL TAKE 1 TABLET BY MOUTH ONCE EVERY EVENING   montelukast 10 MG tablet Commonly known as:  SINGULAIR TAKE 1 TABLET EVERY EVENING   omeprazole 20 MG capsule Commonly known as:  PRILOSEC Take 1 capsule (20 mg total) by mouth daily.   pimecrolimus 1 % cream Commonly known as:  ELIDEL Apply topically 2 (two) times daily.   predniSONE 20 MG tablet Commonly known as:  DELTASONE 2 po at same time daily for 5 days   tacrolimus 0.1 % ointment Commonly known as:  PROTOPIC Apply topically 2 (two) times daily.   triamcinolone cream 0.1 % Commonly known as:  KENALOG Apply topically 2 (two) times daily.       Past Medical History:  Diagnosis Date  . Allergic rhinoconjunctivitis   . Asthma   . Attention deficit disorder (ADD)   . Eczema   . Food allergy     Past Surgical History:  Procedure Laterality Date  . NO  PAST SURGERIES      Review of systems negative except as noted in HPI / PMHx or noted below:  Review of Systems  Constitutional: Negative.   HENT: Negative.   Eyes: Negative.   Respiratory: Negative.   Cardiovascular: Negative.   Gastrointestinal: Negative.   Genitourinary: Negative.   Musculoskeletal: Negative.   Skin: Negative.   Neurological: Negative.   Endo/Heme/Allergies: Negative.   Psychiatric/Behavioral: Negative.      Objective:   Vitals:   06/11/18 1141  BP: 120/76  Pulse: 92  Resp: 16          Physical Exam  HENT:  Head: Normocephalic.  Right Ear: Tympanic membrane, external  ear and ear canal normal.  Left Ear: Tympanic membrane, external ear and ear canal normal.  Nose: Nose normal. No mucosal edema or rhinorrhea.  Mouth/Throat: Uvula is midline, oropharynx is clear and moist and mucous membranes are normal. No oropharyngeal exudate.  Eyes: Conjunctivae are normal.  Neck: Trachea normal. No tracheal tenderness present. No tracheal deviation present. No thyromegaly present.  Cardiovascular: Normal rate, regular rhythm, S1 normal, S2 normal and normal heart sounds.  No murmur heard. Pulmonary/Chest: Breath sounds normal. No stridor. No respiratory distress. She has no wheezes. She has no rales.  Musculoskeletal: She exhibits no edema.  Lymphadenopathy:       Head (right side): No tonsillar adenopathy present.       Head (left side): No tonsillar adenopathy present.    She has no cervical adenopathy.  Neurological: She is alert.  Skin: Rash (Slight erythema antecubital fossa bilaterally, slight patchy erythema upper arms.) noted. She is not diaphoretic. No erythema. Nails show no clubbing.    Diagnostics:    Spirometry was performed and demonstrated an FEV1 of 3.22 @ 109  % of predicted.  The patient had an Asthma Control Test with the following results: ACT Total Score: 22.    Assessment and Plan:   1. Asthma, moderate persistent, well-controlled   2. Allergic rhinoconjunctivitis   3. Other atopic dermatitis   4. Anaphylactic shock due to food, subsequent encounter   5. Eosinophilia     1.  Treat and prevent skin inflammation:   A.  Triamcinolone 0.1% cream + Protopic 0.1% ointment twice a day  B.  Dupilumab injections  2.  Treat and prevent respiratory inflammation:   A.  Symbicort 160 - 2 inhalations 1-2 times per day  3.  If needed:   A.  EpiPen/Auvi-Q  B.  OTC antihistamine -Zyrtec/Claritin/Allegra  C.  Ventolin HFA  4. Obtain fall flu vaccine  5.  Return to clinic in December 2019 or earlier if problem  Casey Weaver has had an excellent  response to dupilumab.  She will continue to utilize this biological agent as well as other anti-inflammatory agents for her skin and airway as noted above and I will see her back in this clinic in December 2019 or earlier if there is a problem.  Laurette SchimkeEric Vrinda Heckstall, MD Allergy / Immunology [facility]

## 2018-06-11 NOTE — Patient Instructions (Signed)
  1.  Treat and prevent skin inflammation:   A.  Triamcinolone 0.1% cream + Protopic 0.1% ointment twice a day  B.  Dupilumab injections  2.  Treat and prevent respiratory inflammation:   A.  Symbicort 160 - 2 inhalations 1-2 times per day  3.  If needed:   A.  EpiPen/Auvi-Q  B.  OTC antihistamine -Zyrtec/Claritin/Allegra  C.  Ventolin HFA  4. Obtain fall flu vaccine  5.  Return to clinic in December 2019 or earlier if problem

## 2018-08-02 ENCOUNTER — Other Ambulatory Visit: Payer: Self-pay | Admitting: Physician Assistant

## 2018-08-02 DIAGNOSIS — F321 Major depressive disorder, single episode, moderate: Secondary | ICD-10-CM

## 2018-09-06 ENCOUNTER — Telehealth: Payer: Self-pay | Admitting: Physician Assistant

## 2018-09-06 DIAGNOSIS — F321 Major depressive disorder, single episode, moderate: Secondary | ICD-10-CM

## 2018-09-06 MED ORDER — ESCITALOPRAM OXALATE 10 MG PO TABS: 10.0000 mg | ORAL_TABLET | Freq: Every day | ORAL | 0 refills | Status: DC

## 2018-09-06 MED ORDER — MONTELUKAST SODIUM 10 MG PO TABS: 10.0000 mg | ORAL_TABLET | Freq: Every evening | ORAL | 0 refills | Status: DC

## 2018-09-06 MED ORDER — ALBUTEROL SULFATE HFA 108 (90 BASE) MCG/ACT IN AERS: INHALATION_SPRAY | RESPIRATORY_TRACT | 6 refills | Status: DC

## 2018-09-06 NOTE — Telephone Encounter (Signed)
Last visit was 08--01-2018 for depression.   Was asked to follow uo in a month.   Please review and advise on medications.

## 2018-09-06 NOTE — Telephone Encounter (Signed)
Okay to send, what is the question about the inhalers?

## 2018-09-06 NOTE — Telephone Encounter (Signed)
Rx sent for patient and rx for ventolin sent.

## 2018-09-06 NOTE — Telephone Encounter (Signed)
PT is needing a refill on singulair, and lexapro. Mom also has question about inhalers (albuterol and Ventolin) [facility] [place]

## 2018-10-02 ENCOUNTER — Ambulatory Visit (INDEPENDENT_AMBULATORY_CARE_PROVIDER_SITE_OTHER): Payer: No Typology Code available for payment source | Admitting: Physician Assistant

## 2018-10-02 VITALS — BP 101/66 | HR 92 | Temp 97.7°F | Ht 60.01 in | Wt 204.0 lb

## 2018-10-02 DIAGNOSIS — Z0001 Encounter for general adult medical examination with abnormal findings: Secondary | ICD-10-CM

## 2018-10-02 DIAGNOSIS — J4551 Severe persistent asthma with (acute) exacerbation: Secondary | ICD-10-CM | POA: Diagnosis not present

## 2018-10-02 DIAGNOSIS — F321 Major depressive disorder, single episode, moderate: Secondary | ICD-10-CM | POA: Diagnosis not present

## 2018-10-02 DIAGNOSIS — K219 Gastro-esophageal reflux disease without esophagitis: Secondary | ICD-10-CM

## 2018-10-02 DIAGNOSIS — Z0000 Encounter for general adult medical examination without abnormal findings: Secondary | ICD-10-CM

## 2018-10-02 MED ORDER — DUPILUMAB 200 MG/1.14ML ~~LOC~~ SOSY: 1.0000 | PREFILLED_SYRINGE | SUBCUTANEOUS | Status: DC

## 2018-10-02 MED ORDER — LEVOCETIRIZINE DIHYDROCHLORIDE 5 MG PO TABS: 5.0000 mg | ORAL_TABLET | Freq: Every evening | ORAL | 3 refills | Status: DC

## 2018-10-02 MED ORDER — OMEPRAZOLE 20 MG PO CPDR: 20.0000 mg | DELAYED_RELEASE_CAPSULE | Freq: Every day | ORAL | 3 refills | Status: DC

## 2018-10-02 MED ORDER — MONTELUKAST SODIUM 10 MG PO TABS: 10.0000 mg | ORAL_TABLET | Freq: Every evening | ORAL | 3 refills | Status: DC

## 2018-10-02 MED ORDER — ESCITALOPRAM OXALATE 10 MG PO TABS: 10.0000 mg | ORAL_TABLET | Freq: Every day | ORAL | 3 refills | Status: DC

## 2018-10-02 MED ORDER — BUDESONIDE-FORMOTEROL FUMARATE 160-4.5 MCG/ACT IN AERO: 2.0000 | INHALATION_SPRAY | Freq: Two times a day (BID) | RESPIRATORY_TRACT | 11 refills | Status: DC

## 2018-10-02 NOTE — Patient Instructions (Signed)

## 2018-10-03 ENCOUNTER — Encounter: Payer: Self-pay | Admitting: Physician Assistant

## 2018-10-03 NOTE — Progress Notes (Signed)
BP 101/66   Pulse 92   Temp 97.7 F (36.5 C) (Oral)   Ht 5' 0.01" (1.524 m)   Wt 204 lb (92.5 kg)   BMI 39.83 kg/m    Subjective:    Patient ID: Casey Weaver, female    DOB: November 19, 2000, 18 y.o.   MRN: [id number]  HPI: Casey Weaver is a 18 y.o. female presenting on 10/02/2018 for Annual Exam  This patient comes in for annual well physical examination. All medications are reviewed today. There are no reports of any problems with the medications. All of the medical conditions are reviewed and updated.  Lab work is reviewed and will be ordered as medically necessary. There are no new problems reported with today's visit.  Patient reports doing well overall.   Past Medical History:  Diagnosis Date  . Allergic rhinoconjunctivitis   . Asthma   . Attention deficit disorder (ADD)   . Eczema   . Food allergy    Relevant past medical, surgical, family and social history reviewed and updated as indicated. Interim medical history since our last visit reviewed. Allergies and medications reviewed and updated. DATA REVIEWED: CHART IN EPIC  Family History reviewed for pertinent findings.  Review of Systems  Constitutional: Negative.  Negative for activity change, fatigue and fever.  HENT: Negative.   Eyes: Negative.   Respiratory: Negative.  Negative for cough.   Cardiovascular: Negative.  Negative for chest pain.  Gastrointestinal: Negative.  Negative for abdominal pain.  Endocrine: Negative.   Genitourinary: Negative.  Negative for dysuria.  Musculoskeletal: Negative.   Skin: Negative.   Neurological: Negative.     Allergies as of 10/02/2018      Reactions   Peanut-containing Drug Products Swelling   PEANUT BUTTER: throat swells   Septra [sulfamethoxazole-trimethoprim] Rash      Medication List       Accurate as of October 02, 2018 11:59 PM. Always use your most recent med list.        albuterol (2.5 MG/3ML) 0.083% nebulizer solution Commonly known as:   PROVENTIL INHALE 1 VIAL VIA NEBULIZER EVERY 6 HOURS   albuterol 108 (90 Base) MCG/ACT inhaler Commonly known as:  VENTOLIN HFA INHALE 2 PUFFS EVERY 4 TO 6 HOURS AS NEEDED FOR COUGH AND WHEEZING   ALPRAZolam 0.5 MG tablet Commonly known as:  XANAX Take 1 tablet (0.5 mg total) by mouth at bedtime as needed for anxiety.   budesonide-formoterol 160-4.5 MCG/ACT inhaler Commonly known as:  SYMBICORT Inhale 2 puffs into the lungs 2 (two) times daily.   Dupilumab 200 MG/1.14ML Sosy Commonly known as:  DUPIXENT Inject 1 Dose into the skin every 14 (fourteen) days.   EPINEPHrine 0.3 mg/0.3 mL Soaj injection Commonly known as:  AUVI-Q Use as directed for severe allergic reaction   escitalopram 10 MG tablet Commonly known as:  LEXAPRO Take 1 tablet (10 mg total) by mouth daily. (Needs to be seen before next refill)   EUCRISA 2 % Oint Generic drug:  Crisaborole APPLY TOPICALLY TWICE A DAY   levocetirizine 5 MG tablet Commonly known as:  XYZAL Take 1 tablet (5 mg total) by mouth every evening.   montelukast 10 MG tablet Commonly known as:  SINGULAIR Take 1 tablet (10 mg total) by mouth every evening.   omeprazole 20 MG capsule Commonly known as:  PRILOSEC Take 1 capsule (20 mg total) by mouth daily.   tacrolimus 0.1 % ointment Commonly known as:  PROTOPIC Apply topically 2 (two) times daily.  triamcinolone cream 0.1 % Commonly known as:  KENALOG Apply topically 2 (two) times daily.          Objective:    BP 101/66   Pulse 92   Temp 97.7 F (36.5 C) (Oral)   Ht 5' 0.01" (1.524 m)   Wt 204 lb (92.5 kg)   BMI 39.83 kg/m   Allergies  Allergen Reactions  . Peanut-Containing Drug Products Swelling    PEANUT BUTTER: throat swells  . Septra [Sulfamethoxazole-Trimethoprim] Rash    Wt Readings from Last 3 Encounters:  10/02/18 204 lb (92.5 kg) (98 %, Z= 2.00)*  05/07/18 199 lb (90.3 kg) (97 %, Z= 1.94)*  05/01/18 200 lb 3.2 oz (90.8 kg) (97 %, Z= 1.95)*   *  Growth percentiles are based on CDC (Girls, 2-20 Years) data.    Physical Exam Constitutional:      Appearance: She is well-developed.  HENT:     Head: Normocephalic and atraumatic.     Right Ear: Tympanic membrane, ear canal and external ear normal.     Left Ear: Tympanic membrane, ear canal and external ear normal.     Nose: Nose normal. No rhinorrhea.     Mouth/Throat:     Pharynx: No oropharyngeal exudate or posterior oropharyngeal erythema.  Eyes:     Conjunctiva/sclera: Conjunctivae normal.     Pupils: Pupils are equal, round, and reactive to light.  Neck:     Musculoskeletal: Normal range of motion and neck supple.  Cardiovascular:     Rate and Rhythm: Normal rate and regular rhythm.     Heart sounds: Normal heart sounds.  Pulmonary:     Effort: Pulmonary effort is normal.     Breath sounds: Normal breath sounds.  Abdominal:     General: Bowel sounds are normal.     Palpations: Abdomen is soft.  Skin:    General: Skin is warm and dry.     Findings: No rash.  Neurological:     Mental Status: She is alert and oriented to person, place, and time.     Deep Tendon Reflexes: Reflexes are normal and symmetric.  Psychiatric:        Behavior: Behavior normal.        Thought Content: Thought content normal.        Judgment: Judgment normal.         Assessment & Plan:   1. Well adult exam - CMP14+EGFR - Lipid panel - CBC with Differential/Platelet - Thyroid Panel With TSH  2. Gastroesophageal reflux disease without esophagitis - omeprazole (PRILOSEC) 20 MG capsule; Take 1 capsule (20 mg total) by mouth daily.  Dispense: 90 capsule; Refill: 3  3. Severe persistent chronic asthma with acute exacerbation - budesonide-formoterol (SYMBICORT) 160-4.5 MCG/ACT inhaler; Inhale 2 puffs into the lungs 2 (two) times daily.  Dispense: 10.2 Inhaler; Refill: 11  4. Depression, major, single episode, moderate (HCC) - escitalopram (LEXAPRO) 10 MG tablet; Take 1 tablet (10 mg total)  by mouth daily. (Needs to be seen before next refill)  Dispense: 90 tablet; Refill: 3   Continue all other maintenance medications as listed above.  Follow up plan: No follow-ups on file.  Educational handout given for [place] PA-C [facility] [address]  [place], [place] [address] [phone]   10/03/2018, 9:01 PM

## 2018-10-07 ENCOUNTER — Other Ambulatory Visit: Payer: Self-pay | Admitting: Physician Assistant

## 2018-10-07 DIAGNOSIS — Z0000 Encounter for general adult medical examination without abnormal findings: Secondary | ICD-10-CM

## 2018-11-19 ENCOUNTER — Ambulatory Visit: Payer: No Typology Code available for payment source | Admitting: Family Medicine

## 2018-11-19 ENCOUNTER — Encounter: Payer: Self-pay | Admitting: Family Medicine

## 2018-11-19 VITALS — BP 119/80 | HR 103 | Temp 98.1°F | Ht 60.0 in | Wt 195.0 lb

## 2018-11-19 DIAGNOSIS — J069 Acute upper respiratory infection, unspecified: Secondary | ICD-10-CM

## 2018-11-19 DIAGNOSIS — R51 Headache: Secondary | ICD-10-CM

## 2018-11-19 DIAGNOSIS — R519 Headache, unspecified: Secondary | ICD-10-CM

## 2018-11-19 LAB — VERITOR FLU A/B WAIVED
Influenza A: NEGATIVE
Influenza B: NEGATIVE

## 2018-11-19 MED ORDER — OSELTAMIVIR PHOSPHATE 75 MG PO CAPS: 75.0000 mg | ORAL_CAPSULE | Freq: Two times a day (BID) | ORAL | 0 refills | Status: DC

## 2018-11-19 NOTE — Progress Notes (Signed)
Chief Complaint  Patient presents with  . Nausea    pt here today c/o nausea, stomach pain, fatigue and headache since yesterday    HPI  Patient presents today for patient presents withbody aches, HA and stomache ache with nausea.  Diffuse headache of moderate intensity. Patient also has chills and subjective fever. Body aches worst in the back but present in the legs, shoulders, and torso as well. Has sapped the energy Onset yesterday afternoon around 2 pm   PMH: Smoking status noted ROS: Per HPI  Objective: BP 119/80   Pulse (!) 103   Temp 98.1 F (36.7 C) (Oral)   Ht 5' (1.524 m)   Wt 195 lb (88.5 kg)   BMI 38.08 kg/m  Gen: NAD, alert, cooperative with exam HEENT: NCAT, EOMI, PERRL CV: RRR, good S1/S2, no murmur Resp: CTABL, no wheezes, non-labored Abd: SNTND, BS present, no guarding or organomegaly Ext: No edema, warm Neuro: Alert and oriented, No gross deficits  Assessment and plan:  1. Nonintractable headache, unspecified chronicity pattern, unspecified headache type   2. Viral URI     Meds ordered this encounter  Medications  . oseltamivir (TAMIFLU) 75 MG capsule    Sig: Take 1 capsule (75 mg total) by mouth 2 (two) times daily.    Dispense:  10 capsule    Refill:  0    Orders Placed This Encounter  Procedures  . Veritor Flu A/B Waived    Order Specific Question:   Source    Answer:   nasal    Follow up as needed.  Mechele Claude, MD

## 2018-11-20 ENCOUNTER — Other Ambulatory Visit: Payer: Self-pay | Admitting: Physician Assistant

## 2018-11-20 MED ORDER — ONDANSETRON 8 MG PO TBDP: 8.0000 mg | ORAL_TABLET | Freq: Three times a day (TID) | ORAL | 0 refills | Status: DC | PRN

## 2018-11-20 NOTE — Telephone Encounter (Signed)
Patient aware.

## 2018-11-20 NOTE — Telephone Encounter (Signed)
Medication is sent to the pharmacy.

## 2018-12-10 ENCOUNTER — Ambulatory Visit: Payer: No Typology Code available for payment source | Admitting: Allergy and Immunology
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
## 2019-01-14 ENCOUNTER — Ambulatory Visit: Payer: No Typology Code available for payment source | Admitting: Allergy and Immunology

## 2019-01-28 ENCOUNTER — Encounter: Payer: Self-pay | Admitting: Allergy and Immunology

## 2019-01-28 ENCOUNTER — Ambulatory Visit (INDEPENDENT_AMBULATORY_CARE_PROVIDER_SITE_OTHER): Payer: No Typology Code available for payment source | Admitting: Allergy and Immunology

## 2019-01-28 ENCOUNTER — Other Ambulatory Visit: Payer: Self-pay

## 2019-01-28 DIAGNOSIS — J3089 Other allergic rhinitis: Secondary | ICD-10-CM

## 2019-01-28 DIAGNOSIS — D721 Eosinophilia, unspecified: Secondary | ICD-10-CM

## 2019-01-28 DIAGNOSIS — J4540 Moderate persistent asthma, uncomplicated: Secondary | ICD-10-CM

## 2019-01-28 DIAGNOSIS — L2089 Other atopic dermatitis: Secondary | ICD-10-CM

## 2019-01-28 DIAGNOSIS — T7800XD Anaphylactic reaction due to unspecified food, subsequent encounter: Secondary | ICD-10-CM | POA: Diagnosis not present

## 2019-01-28 NOTE — Progress Notes (Signed)
[place] [facility] [place] - [place] - [place] [facility] [place]   Follow-up Note  Referring Provider: Remus Loffler, PA-C Primary Provider: Caryl Never Date of Office Visit: 01/28/2019  Subjective:   Casey Weaver (DOB: 2000/10/22) is a 18 y.o. female who [facility] 01[facility]2019 in re-evaluation of the following:  HPI: This is a E-med visit requested by patient who is located at home. [place] is followed in this clinic for multiorgan atopic disease including severe atopic dermatitis, allergic rhinoconjunctivitis, asthma, food allergy directed against peanuts and tree nuts, and eosinophilia.  Her last visit to this clinic was 11 June 2018.  Skin doing well. Using dupilumab every 2 weeks. Rarely uses triamcinolone 0.1 cream averages out to less than one time per month.   Asthma OK without need for a systemic steroid or Symbicort. Rare use of SABA except prior to running.   Nose OK without any treatment. No need for antibiotics.   Not eating peanuts and tree nuts.  Have not received flu vaccine  Allergies as of 01/28/2019      Reactions   Peanut-containing Drug Products Swelling   PEANUT BUTTER: throat swells   Septra [sulfamethoxazole-trimethoprim] Rash      Medication List    albuterol (2.5 MG/3ML) 0.083% nebulizer solution Commonly known as:  PROVENTIL INHALE 1 VIAL VIA NEBULIZER EVERY 6 HOURS   albuterol 108 (90 Base) MCG/ACT inhaler Commonly known as:  Ventolin HFA INHALE 2 PUFFS EVERY 4 TO 6 HOURS AS NEEDED FOR COUGH AND WHEEZING   ALPRAZolam 0.5 MG tablet Commonly known as:  Xanax Take 1 tablet (0.5 mg total) by mouth at bedtime as needed for anxiety.   budesonide-formoterol 160-4.5 MCG/ACT inhaler Commonly known as:  Symbicort Inhale 2 puffs into the lungs 2 (two) times daily.   Dupilumab 200 MG/1. Sosy Commonly known as:  Dupixent Inject 1 Dose into the skin every 14 (fourteen) days.   EPINEPHrine 0.3 mg/0.3 mL  Soaj injection Commonly known as:  Auvi-Q Use as directed for severe allergic reaction   escitalopram 10 MG tablet Commonly known as:  LEXAPRO Take 1 tablet (10 mg total) by mouth daily. (Needs to be seen before next refill)   Eucrisa 2 % Oint Generic drug:  Crisaborole APPLY TOPICALLY TWICE A DAY   levocetirizine 5 MG tablet Commonly known as:  XYZAL Take 1 tablet (5 mg total) by mouth every evening.   montelukast 10 MG tablet Commonly known as:  SINGULAIR Take 1 tablet (10 mg total) by mouth every evening.   omeprazole 20 MG capsule Commonly known as:  PRILOSEC Take 1 capsule (20 mg total) by mouth daily.   ondansetron 8 MG disintegrating tablet Commonly known as:  Zofran ODT Take 1 tablet (8 mg total) by mouth every 8 (eight) hours as needed for nausea or vomiting.   tacrolimus 0.1 % ointment Commonly known as:  PROTOPIC Apply topically 2 (two) times daily.   triamcinolone cream 0.1 % Commonly known as:  KENALOG Apply topically 2 (two) times daily.       Past Medical History:  Diagnosis Date  . Allergic rhinoconjunctivitis   . Asthma   . Attention deficit disorder (ADD)   . Eczema   . Food allergy     Past Surgical History:  Procedure Laterality Date  . NO PAST SURGERIES      Review of systems negative except as noted in HPI / PMHx or noted below:  Review of Systems  Constitutional: Negative.   HENT: Negative.   Eyes: Negative.   Respiratory: Negative.   Cardiovascular: Negative.   Gastrointestinal: Negative.   Genitourinary: Negative.   Musculoskeletal: Negative.   Skin: Negative.   Neurological: Negative.   Endo/Heme/Allergies: Negative.   Psychiatric/Behavioral: Negative.      Objective:   There were no vitals filed for this visit.        Physical Exam-deferred  Diagnostics: none  Assessment and Plan:   1. Asthma, moderate persistent, well-controlled   2. Perennial allergic rhinitis   3. Other atopic dermatitis   4.  Anaphylactic shock due to food, subsequent encounter   5. Eosinophilia     1.  Treat and prevent skin and airway inflammation:   A. Dupilumab injections  2.  If needed:   A.  EpiPen/Auvi-Q  B.  OTC antihistamine -Zyrtec/Claritin/Allegra  C.  Ventolin HFA  D. Triamcinolone 0.1% cream  3. Can restart Symbicort 160 - 2 inhalations every 12 hours if asthma activity  4. Obtain fall flu vaccine every year  5.  Return to clinic in 6 months or earlier if problem  Casey Weaver appears to be doing very well regarding both the inflammatory condition of her skin and her airway while using dupilumab injections.  Almost all of her other medication requirement has been eliminated.  We will continue her on this biological agent and I will see her back in this clinic in 6 months or earlier if there is a problem.  Total patient interaction time 20 minutes  Laurette SchimkeEric Kaelin Bonelli, MD Allergy / Immunology [facility]

## 2019-01-28 NOTE — Patient Instructions (Addendum)
  1.  Treat and prevent skin and airway inflammation:   A. Dupilumab injections  2.  If needed:   A.  EpiPen/Auvi-Q  B.  OTC antihistamine -Zyrtec/Claritin/Allegra  C.  Ventolin HFA  D. Triamcinolone 0.1% cream  3. Can restart Symbicort 160 - 2 inhalations every 12 hours if asthma activity  4. Obtain fall flu vaccine every year  5.  Return to clinic in 6 months or earlier if problem

## 2019-01-29 ENCOUNTER — Encounter: Payer: Self-pay | Admitting: Allergy and Immunology

## 2019-05-19 ENCOUNTER — Other Ambulatory Visit: Payer: Self-pay | Admitting: Allergy and Immunology

## 2019-06-16 ENCOUNTER — Encounter: Payer: Self-pay | Admitting: Physician Assistant

## 2019-06-16 ENCOUNTER — Ambulatory Visit (INDEPENDENT_AMBULATORY_CARE_PROVIDER_SITE_OTHER): Payer: PRIVATE HEALTH INSURANCE | Admitting: Physician Assistant

## 2019-06-16 DIAGNOSIS — J0110 Acute frontal sinusitis, unspecified: Secondary | ICD-10-CM | POA: Diagnosis not present

## 2019-06-16 MED ORDER — AMOXICILLIN-POT CLAVULANATE 875-125 MG PO TABS: 1.0000 | ORAL_TABLET | Freq: Two times a day (BID) | ORAL | 0 refills | Status: DC

## 2019-06-16 NOTE — Progress Notes (Signed)
Telephone visit  Subjective: IF:[id number] pain PCP: Remus Loffler, PA-C Casey Weaver is a 18 y.o. female calls for telephone consult today. Patient provides verbal consent for consult held via phone.  Patient is identified with 2 separate identifiers.  At this time the entire area is on COVID-19 social distancing and stay home orders are in place.  Patient is of higher risk and therefore we are performing this by a virtual method.  Location of patient: home Location of provider: [facility] Others present for call: no  This patient has had many days of sinus headache and postnasal drainage. There is copious drainage at times. Denies any fever at this time. There has been a history of sinus infections in the past.  No history of sinus surgery. There is cough at night. It has become more prevalent in recent days.  Patient has known allergies and asthma.  She has been using her medication.  She states that she has been sick for almost 8 days now.   ROS: Per HPI  Allergies  Allergen Reactions  . Peanut-Containing Drug Products Swelling    PEANUT BUTTER: throat swells  . Septra [Sulfamethoxazole-Trimethoprim] Rash   Past Medical History:  Diagnosis Date  . Allergic rhinoconjunctivitis   . Asthma   . Attention deficit disorder (ADD)   . Eczema   . Food allergy     Current Outpatient Medications:  .  albuterol (PROVENTIL) (2.5 MG/3ML) 0.083% nebulizer solution, INHALE 1 VIAL VIA NEBULIZER EVERY 6 HOURS, Disp: 375 mL, Rfl: 1 .  albuterol (VENTOLIN HFA) 108 (90 Base) MCG/ACT inhaler, INHALE 2 PUFFS EVERY 4 TO 6 HOURS AS NEEDED FOR COUGH AND WHEEZING, Disp: 18 Inhaler, Rfl: 6 .  ALPRAZolam (XANAX) 0.5 MG tablet, Take 1 tablet (0.5 mg total) by mouth at bedtime as needed for anxiety., Disp: 20 tablet, Rfl: 0 .  amoxicillin-clavulanate (AUGMENTIN) 875-125 MG tablet, Take 1 tablet by mouth 2 (two) times daily., Disp: 20 tablet, Rfl: 0 .  budesonide-formoterol (SYMBICORT) 160-4.5 MCG/ACT  inhaler, Inhale 2 puffs into the lungs 2 (two) times daily., Disp: 10.2 Inhaler, Rfl: 11 .  Dupilumab (DUPIXENT) 200 MG/1. SOSY, Inject 1 Dose into the skin every 14 (fourteen) days., Disp: , Rfl:  .  DUPIXENT 300 MG/2ML prefilled syringe, INJECT 300MG  (1 SYRINGE) SUBCUTANEOUSLY EVERY OTHER WEEK., Disp: 4 mL, Rfl: 11 .  EPINEPHrine (AUVI-Q) 0.3 mg/0.3 mL IJ SOAJ injection, Use as directed for severe allergic reaction, Disp: 2 Device, Rfl: 1 .  escitalopram (LEXAPRO) 10 MG tablet, Take 1 tablet (10 mg total) by mouth daily. (Needs to be seen before next refill), Disp: 90 tablet, Rfl: 3 .  EUCRISA 2 % OINT, APPLY TOPICALLY TWICE A DAY, Disp: 60 g, Rfl: 1 .  levocetirizine (XYZAL) 5 MG tablet, Take 1 tablet (5 mg total) by mouth every evening., Disp: 90 tablet, Rfl: 3 .  montelukast (SINGULAIR) 10 MG tablet, Take 1 tablet (10 mg total) by mouth every evening., Disp: 90 tablet, Rfl: 3 .  omeprazole (PRILOSEC) 20 MG capsule, Take 1 capsule (20 mg total) by mouth daily., Disp: 90 capsule, Rfl: 3 .  ondansetron (ZOFRAN ODT) 8 MG disintegrating tablet, Take 1 tablet (8 mg total) by mouth every 8 (eight) hours as needed for nausea or vomiting., Disp: 20 tablet, Rfl: 0 .  tacrolimus (PROTOPIC) 0.1 % ointment, Apply topically 2 (two) times daily., Disp: 100 g, Rfl: 2 .  triamcinolone cream (KENALOG) 0.1 %, Apply topically 2 (two) times daily., Disp:  454 g, Rfl: 2  Assessment/ Plan: 18 y.o. female   1. Acute non-recurrent frontal sinusitis - amoxicillin-clavulanate (AUGMENTIN) 875-125 MG tablet; Take 1 tablet by mouth 2 (two) times daily.  Dispense: 20 tablet; Refill: 0   No follow-ups on file.  Continue all other maintenance medications as listed above.  Start time: 1:15 PM End time: 1:21 PM  Meds ordered this encounter  Medications  . amoxicillin-clavulanate (AUGMENTIN) 875-125 MG tablet    Sig: Take 1 tablet by mouth 2 (two) times daily.    Dispense:  20 tablet    Refill:  0    Order  Specific Question:   Supervising Provider    Answer:   Janora Norlander [[id number]]    Particia Nearing PA-C [facility] [phone]

## 2019-10-16 ENCOUNTER — Other Ambulatory Visit: Payer: Self-pay | Admitting: Physician Assistant

## 2019-10-25 ENCOUNTER — Other Ambulatory Visit: Payer: Self-pay | Admitting: Physician Assistant

## 2019-12-01 ENCOUNTER — Encounter: Payer: Self-pay | Admitting: Emergency Medicine

## 2019-12-01 ENCOUNTER — Other Ambulatory Visit: Payer: Self-pay

## 2019-12-01 ENCOUNTER — Ambulatory Visit
Admission: EM | Admit: 2019-12-01 | Discharge: 2019-12-01 | Disposition: A | Discharge status: Home / Self Care | Payer: Managed Care, Other (non HMO) | Attending: Family Medicine | Admitting: Family Medicine

## 2019-12-01 DIAGNOSIS — J351 Hypertrophy of tonsils: Secondary | ICD-10-CM | POA: Diagnosis present

## 2019-12-01 LAB — POCT RAPID STREP A (OFFICE): Rapid Strep A Screen: NEGATIVE

## 2019-12-01 MED ORDER — IBUPROFEN 800 MG PO TABS: 800.0000 mg | ORAL_TABLET | Freq: Three times a day (TID) | ORAL | 0 refills | Status: DC

## 2019-12-01 NOTE — ED Provider Notes (Signed)
[facility]    CSN: [id number] Arrival date & time: 12/01/19  1340      History   Chief Complaint Chief Complaint  Patient presents with  . tonsils swollen    HPI Casey Weaver is a 19 y.o. female history of asthma, eczema, presenting today for evaluation of tonsillar swelling.  Patient states that in January she was sick with URI symptoms for a couple of days.  Symptoms resolved.  She began to develop tonsil stones which she was having daily for approximately 3 weeks.  She had noticed left-sided tonsillar swelling associated with this.  Denies any associated pain or discomfort.  Does feel like swelling may be affecting her breathing.  She is concerned about this progressing and causing worsening swelling/breathing issues.  She denies any fevers chills or body aches.  Otherwise has felt normal.  HPI  Past Medical History:  Diagnosis Date  . Allergic rhinoconjunctivitis   . Asthma   . Attention deficit disorder (ADD)   . Eczema   . Food allergy     Patient Active Problem List   Diagnosis Date Noted  . Depression, major, single episode, moderate (HCC) 04/22/2018  . Gastroesophageal reflux disease without esophagitis 06/16/2017  . Eczema 11/06/2016  . Chronic asthma 06/30/2016  . Intrinsic eczema 06/30/2016  . Attention deficit hyperactivity disorder (ADHD), predominantly inattentive type 06/30/2016    Past Surgical History:  Procedure Laterality Date  . NO PAST SURGERIES      OB History   No obstetric history on file.      Home Medications    Prior to Admission medications   Medication Sig Start Date End Date Taking? Authorizing Provider  albuterol (PROVENTIL) (2.5 MG/3ML) 0.083% nebulizer solution INHALE 1 VIAL VIA NEBULIZER EVERY 6 HOURS 10/04/17   Remus Loffler, PA-C  albuterol (VENTOLIN HFA) 108 (90 Base) MCG/ACT inhaler INHALE 2 PUFFS BY MOUTH EVERY 4 TO 6 HOURS AS NEEDED FOR COUGH AND FOR WHEEZING-needs to be seen for next refill 10/16/19    Remus Loffler, PA-C  ALPRAZolam Prudy Feeler) 0.5 MG tablet Take 1 tablet (0.5 mg total) by mouth at bedtime as needed for anxiety. Patient not taking: Reported on 12/01/2019 12/21/17   Remus Loffler, PA-C  amoxicillin-clavulanate (AUGMENTIN) 875-125 MG tablet Take 1 tablet by mouth 2 (two) times daily. Patient not taking: Reported on 12/01/2019 06/16/19   Remus Loffler, PA-C  budesonide-formoterol [facility]) 160-4.5 MCG/ACT inhaler Inhale 2 puffs into the lungs 2 (two) times daily. 10/02/18   Remus Loffler, PA-C  Dupilumab (DUPIXENT) 200 MG/1. SOSY Inject 1 Dose into the skin every 14 (fourteen) days. 10/02/18   Remus Loffler, PA-C  DUPIXENT 300 MG/2ML prefilled syringe INJECT 300MG  (1 SYRINGE) SUBCUTANEOUSLY EVERY OTHER WEEK. 05/19/19   Kozlow, 05/21/19, MD  EPINEPHrine (AUVI-Q) 0.3 mg/0.3 mL IJ SOAJ injection Use as directed for severe allergic reaction 05/07/18   Kozlow, 05/09/18, MD  escitalopram (LEXAPRO) 10 MG tablet Take 1 tablet (10 mg total) by mouth daily. (Needs to be seen before next refill) 10/02/18   10/04/18, PA-C  EUCRISA 2 % OINT APPLY TOPICALLY TWICE A DAY 10/01/17   10/03/17, PA-C  ibuprofen (ADVIL) 800 MG tablet Take 1 tablet (800 mg total) by mouth 3 (three) times daily. 12/01/19   Azam Gervasi C, PA-C  levocetirizine (XYZAL) 5 MG tablet TAKE 1 TABLET BY MOUTH ONCE DAILY IN THE EVENING 10/27/19   12/25/19, PA-C  montelukast (SINGULAIR)  10 MG tablet Take 1 tablet (10 mg total) by mouth every evening. 10/02/18   Terald Sleeper, PA-C  omeprazole (PRILOSEC) 20 MG capsule Take 1 capsule (20 mg total) by mouth daily. 10/02/18   Terald Sleeper, PA-C  ondansetron (ZOFRAN ODT) 8 MG disintegrating tablet Take 1 tablet (8 mg total) by mouth every 8 (eight) hours as needed for nausea or vomiting. Patient not taking: Reported on 12/01/2019 11/20/18   Terald Sleeper, PA-C  tacrolimus (PROTOPIC) 0.1 % ointment Apply topically 2 (two) times daily. Patient not taking: Reported on 12/01/2019  05/07/18   Jiles Prows, MD  triamcinolone cream (KENALOG) 0.1 % Apply topically 2 (two) times daily. Patient not taking: Reported on 12/01/2019 05/01/18   Dettinger, Fransisca Kaufmann, MD    Family History Family History  Problem Relation Age of Onset  . Eczema Mother   . Allergic rhinitis Mother   . Angioedema Neg Hx   . Asthma Neg Hx   . Immunodeficiency Neg Hx   . Urticaria Neg Hx     Social History Social History   Tobacco Use  . Smoking status: Never Smoker  . Smokeless tobacco: Never Used  Substance Use Topics  . Alcohol use: No  . Drug use: No     Allergies   Peanut-containing drug products and Septra [sulfamethoxazole-trimethoprim]   Review of Systems Review of Systems  Constitutional: Negative for activity change, appetite change, chills, fatigue and fever.  HENT: Positive for trouble swallowing. Negative for congestion, ear pain, rhinorrhea, sinus pressure and sore throat.   Eyes: Negative for discharge and redness.  Respiratory: Negative for cough, chest tightness and shortness of breath.   Cardiovascular: Negative for chest pain.  Gastrointestinal: Negative for abdominal pain, diarrhea, nausea and vomiting.  Musculoskeletal: Negative for myalgias.  Skin: Negative for rash.  Neurological: Negative for dizziness, light-headedness and headaches.     Physical Exam Triage Vital Signs ED Triage Vitals [12/01/19 1359]  Enc Vitals Group     BP (!) 151/86     Pulse Rate 94     Resp 18     Temp 97.9 F (36.6 C)     Temp Source Oral     SpO2 98 %     Weight      Height      Head Circumference      Peak Flow      Pain Score 0     Pain Loc      Pain Edu?      Excl. in [place]?    No data found.  Updated Vital Signs BP (!) 151/86 (BP Location: Right Arm)   Pulse 94   Temp 97.9 F (36.6 C) (Oral)   Resp 18   SpO2 98%   Visual Acuity Right Eye Distance:   Left Eye Distance:   Bilateral Distance:    Right Eye Near:   Left Eye Near:    Bilateral Near:       Physical Exam Vitals and nursing note reviewed.  Constitutional:      General: She is not in acute distress.    Appearance: She is well-developed.  HENT:     Head: Normocephalic and atraumatic.     Ears:     Comments: Bilateral ears without tenderness to palpation of external auricle, tragus and mastoid, EAC's without erythema or swelling, TM's with good bony landmarks and cone of light. Non erythematous.     Mouth/Throat:     Comments: Mildly swollen tonsils,  left greater than right, appear pink without significant erythema, no exudate Uvula midline, posterior pharynx patent No soft palate swelling Eyes:     Conjunctiva/sclera: Conjunctivae normal.  Neck:     Comments: Full active range of motion of neck, no lymphadenopathy Cardiovascular:     Rate and Rhythm: Normal rate and regular rhythm.     Heart sounds: No murmur.  Pulmonary:     Effort: Pulmonary effort is normal. No respiratory distress.     Breath sounds: Normal breath sounds.     Comments: Breathing comfortably at rest, CTABL, no wheezing, rales or other adventitious sounds auscultated Abdominal:     Palpations: Abdomen is soft.     Tenderness: There is no abdominal tenderness.  Musculoskeletal:     Cervical back: Neck supple.  Skin:    General: Skin is warm and dry.  Neurological:     Mental Status: She is alert.      UC Treatments / Results  Labs (all labs ordered are listed, but only abnormal results are displayed) Labs Reviewed  CULTURE, GROUP A STREP [facility])  POCT RAPID STREP A (OFFICE)    EKG   Radiology No results found.  Procedures Procedures (including critical care time)  Medications Ordered in UC Medications - No data to display  Initial Impression / Assessment and Plan / UC Course  I have reviewed the triage vital signs and the nursing notes.  Pertinent labs & imaging results that were available during my care of the patient were reviewed by me and considered in my medical  decision making (see chart for details).     Strep test negative.  Without any pain associated with swelling.  Do not suspect infectious etiology at this time.  Recommending Tylenol and ibuprofen to help with swelling and close monitoring.Discussed strict return precautions. Patient verbalized understanding and is agreeable with plan.   Final Clinical Impressions(s) / UC Diagnoses   Final diagnoses:  Swollen tonsil     Discharge Instructions     Use anti-inflammatories for pain/swelling. You may take up to 800 mg Ibuprofen every 8 hours with food. You may supplement Ibuprofen with Tylenol [phone] mg every 8 hours.   Follow up if developing pain/ other symptoms   ED Prescriptions    Medication Sig Dispense Auth. Provider   ibuprofen (ADVIL) 800 MG tablet Take 1 tablet (800 mg total) by mouth 3 (three) times daily. 21 tablet Letoya Stallone, [place] C, PA-C     PDMP not reviewed this encounter.   Lew Dawes, [place] 12/01/19 1538

## 2019-12-01 NOTE — ED Triage Notes (Signed)
Pt here for left tonsil swelling x 2 months without pain; pt sts hx of tonsils stones; pt denies fever or other sx

## 2019-12-01 NOTE — Discharge Instructions (Signed)
Use anti-inflammatories for pain/swelling. You may take up to 800 mg Ibuprofen every 8 hours with food. You may supplement Ibuprofen with Tylenol [phone] mg every 8 hours.   Follow up if developing pain/ other symptoms

## 2019-12-03 ENCOUNTER — Other Ambulatory Visit: Payer: Self-pay | Admitting: Physician Assistant

## 2019-12-03 DIAGNOSIS — K219 Gastro-esophageal reflux disease without esophagitis: Secondary | ICD-10-CM

## 2019-12-04 LAB — CULTURE, GROUP A STREP (THRC)

## 2019-12-23 ENCOUNTER — Ambulatory Visit: Payer: PRIVATE HEALTH INSURANCE | Admitting: Physician Assistant

## 2019-12-23 ENCOUNTER — Encounter: Payer: Self-pay | Admitting: Family Medicine

## 2019-12-23 ENCOUNTER — Ambulatory Visit (INDEPENDENT_AMBULATORY_CARE_PROVIDER_SITE_OTHER): Payer: No Typology Code available for payment source | Admitting: Family Medicine

## 2019-12-23 ENCOUNTER — Ambulatory Visit: Payer: PRIVATE HEALTH INSURANCE | Admitting: Family Medicine

## 2019-12-23 ENCOUNTER — Other Ambulatory Visit: Payer: Self-pay

## 2019-12-23 VITALS — BP 133/86 | HR 117 | Temp 99.4°F | Ht 60.0 in | Wt 204.0 lb

## 2019-12-23 DIAGNOSIS — J358 Other chronic diseases of tonsils and adenoids: Secondary | ICD-10-CM

## 2019-12-23 DIAGNOSIS — J4520 Mild intermittent asthma, uncomplicated: Secondary | ICD-10-CM

## 2019-12-23 DIAGNOSIS — L2084 Intrinsic (allergic) eczema: Secondary | ICD-10-CM

## 2019-12-23 DIAGNOSIS — K219 Gastro-esophageal reflux disease without esophagitis: Secondary | ICD-10-CM | POA: Diagnosis not present

## 2019-12-23 MED ORDER — EUCRISA 2 % EX OINT: 1.0000 "application " | TOPICAL_OINTMENT | Freq: Two times a day (BID) | CUTANEOUS | 1 refills | Status: DC

## 2019-12-23 MED ORDER — MONTELUKAST SODIUM 10 MG PO TABS: 10.0000 mg | ORAL_TABLET | Freq: Every evening | ORAL | 3 refills | Status: DC

## 2019-12-23 MED ORDER — ALBUTEROL SULFATE HFA 108 (90 BASE) MCG/ACT IN AERS: INHALATION_SPRAY | RESPIRATORY_TRACT | 1 refills | Status: DC

## 2019-12-23 MED ORDER — OMEPRAZOLE 20 MG PO CPDR: 20.0000 mg | DELAYED_RELEASE_CAPSULE | Freq: Every day | ORAL | 3 refills | Status: DC

## 2019-12-23 NOTE — Patient Instructions (Signed)
Set up annual exam,w/ pap smear and fasting labs after your next birthday   Pap Test Why am I having this test? A Pap test, also called a Pap smear, is a screening test to check for signs of:  Cancer of the vagina, cervix, and uterus. The cervix is the lower part of the uterus that opens into the vagina.  Infection.  Changes that may be a sign that cancer is developing (precancerous changes). Women need this test on a regular basis. In general, you should have a Pap test every 3 years until you reach menopause or age 67. Women aged 30-60 may choose to have their Pap test done at the same time as an HPV (human papillomavirus) test every 5 years (instead of every 3 years). Your health care provider may recommend having Pap tests more or less often depending on your medical conditions and past Pap test results. What kind of sample is taken?  Your health care provider will collect a sample of cells from the surface of your cervix. This will be done using a small cotton swab, plastic spatula, or brush. This sample is often collected during a pelvic exam, when you are lying on your back on an exam table with feet in footrests (stirrups). In some cases, fluids (secretions) from the cervix or vagina may also be collected. How do I prepare for this test?  Be aware of where you are in your menstrual cycle. If you are menstruating on the day of the test, you may be asked to reschedule.  You may need to reschedule if you have a known vaginal infection on the day of the test.  Follow instructions from your health care provider about: ? Changing or stopping your regular medicines. Some medicines can cause abnormal test results, such as digitalis and tetracycline. ? Avoiding douching or taking a bath the day before or the day of the test. Tell a health care provider about:  Any allergies you have.  All medicines you are taking, including vitamins, herbs, eye drops, creams, and over-the-counter  medicines.  Any blood disorders you have.  Any surgeries you have had.  Any medical conditions you have.  Whether you are pregnant or may be pregnant. How are the results reported? Your test results will be reported as either abnormal or normal. A false-positive result can occur. A false positive is incorrect because it means that a condition is present when it is not. A false-negative result can occur. A false negative is incorrect because it means that a condition is not present when it is. What do the results mean? A normal test result means that you do not have signs of cancer of the vagina, cervix, or uterus. An abnormal result may mean that you have:  Cancer. A Pap test by itself is not enough to diagnose cancer. You will have more tests done in this case.  Precancerous changes in your vagina, cervix, or uterus.  Inflammation of the cervix.  An STD (sexually transmitted disease).  A fungal infection.  A parasite infection. Talk with your health care provider about what your results mean. Questions to ask your health care provider Ask your health care provider, or the department that is doing the test:  When will my results be ready?  How will I get my results?  What are my treatment options?  What other tests do I need?  What are my next steps? Summary  In general, women should have a Pap test every 3 years  until they reach menopause or age 22.  Your health care provider will collect a sample of cells from the surface of your cervix. This will be done using a small cotton swab, plastic spatula, or brush.  In some cases, fluids (secretions) from the cervix or vagina may also be collected. This information is not intended to replace advice given to you by your health care provider. Make sure you discuss any questions you have with your health care provider. Document Revised: 05/14/2017 Document Reviewed: 05/14/2017 Elsevier Patient Education  [place]

## 2019-12-23 NOTE — Progress Notes (Signed)
Subjective: CC:est care , check up/med refills PCP: Janora Norlander, DO HEN:IDPOE C Prindle is a 19 y.o. female presenting to clinic today for:  1.  Atopic dermatitis/allergy induced asthma Patient reports longstanding history of the above.  She reports good control with Dupixent, intermittent use of topical triamcinolone and Eucrisa.  She also takes Xyzal and Singulair daily.  She likes keeping albuterol inhaler on hand but does not require this for daily use.  In fact, she uses it rarely and only in an emergency.  She does report a left tonsil which is enlarged.  She was evaluated in urgent care a couple of months ago but it did not appear infected so no antibiotics were pursued.  She does report chronic snoring that preceded enlargement of the tonsil.  She has frequently had tonsil stones.  No difficulty swallowing, change in voice, apneic symptoms.   ROS: Per HPI  Allergies  Allergen Reactions  . Peanut-Containing Drug Products Swelling    PEANUT BUTTER: throat swells  . Septra [Sulfamethoxazole-Trimethoprim] Rash   Past Medical History:  Diagnosis Date  . Allergic rhinoconjunctivitis   . Asthma   . Attention deficit disorder (ADD)   . Eczema   . Food allergy     Current Outpatient Medications:  .  albuterol (PROVENTIL) (2.5 MG/3ML) 0.083% nebulizer solution, INHALE 1 VIAL VIA NEBULIZER EVERY 6 HOURS, Disp: 375 mL, Rfl: 1 .  albuterol (VENTOLIN HFA) 108 (90 Base) MCG/ACT inhaler, INHALE 2 PUFFS BY MOUTH EVERY 4 TO 6 HOURS AS NEEDED FOR COUGH AND FOR WHEEZING-needs to be seen for next refill, Disp: 18 g, Rfl: 0 .  ALPRAZolam (XANAX) 0.5 MG tablet, Take 1 tablet (0.5 mg total) by mouth at bedtime as needed for anxiety. (Patient not taking: Reported on 12/01/2019), Disp: 20 tablet, Rfl: 0 .  amoxicillin-clavulanate (AUGMENTIN) 875-125 MG tablet, Take 1 tablet by mouth 2 (two) times daily. (Patient not taking: Reported on 12/01/2019), Disp: 20 tablet, Rfl: 0 .   budesonide-formoterol (SYMBICORT) 160-4.5 MCG/ACT inhaler, Inhale 2 puffs into the lungs 2 (two) times daily., Disp: 10.2 Inhaler, Rfl: 11 .  Dupilumab (DUPIXENT) 200 MG/1.14ML SOSY, Inject 1 Dose into the skin every 14 (fourteen) days., Disp: , Rfl:  .  DUPIXENT 300 MG/2ML prefilled syringe, INJECT 300MG  (1 SYRINGE) SUBCUTANEOUSLY EVERY OTHER WEEK., Disp: 4 mL, Rfl: 11 .  EPINEPHrine (AUVI-Q) 0.3 mg/0.3 mL IJ SOAJ injection, Use as directed for severe allergic reaction, Disp: 2 Device, Rfl: 1 .  escitalopram (LEXAPRO) 10 MG tablet, Take 1 tablet (10 mg total) by mouth daily. (Needs to be seen before next refill), Disp: 90 tablet, Rfl: 3 .  EUCRISA 2 % OINT, APPLY TOPICALLY TWICE A DAY, Disp: 60 g, Rfl: 1 .  ibuprofen (ADVIL) 800 MG tablet, Take 1 tablet (800 mg total) by mouth 3 (three) times daily., Disp: 21 tablet, Rfl: 0 .  levocetirizine (XYZAL) 5 MG tablet, TAKE 1 TABLET BY MOUTH ONCE DAILY IN THE EVENING, Disp: 90 tablet, Rfl: 0 .  montelukast (SINGULAIR) 10 MG tablet, Take 1 tablet (10 mg total) by mouth every evening., Disp: 90 tablet, Rfl: 3 .  omeprazole (PRILOSEC) 20 MG capsule, Take 1 capsule by mouth once daily, Disp: 90 capsule, Rfl: 0 .  ondansetron (ZOFRAN ODT) 8 MG disintegrating tablet, Take 1 tablet (8 mg total) by mouth every 8 (eight) hours as needed for nausea or vomiting. (Patient not taking: Reported on 12/01/2019), Disp: 20 tablet, Rfl: 0 .  tacrolimus (PROTOPIC) 0.1 % ointment,  Apply topically 2 (two) times daily. (Patient not taking: Reported on 12/01/2019), Disp: 100 g, Rfl: 2 .  triamcinolone cream (KENALOG) 0.1 %, Apply topically 2 (two) times daily. (Patient not taking: Reported on 12/01/2019), Disp: 454 g, Rfl: 2 Social History   Socioeconomic History  . Marital status: Single    Spouse name: Not on file  . Number of children: Not on file  . Years of education: Not on file  . Highest education level: Not on file  Occupational History  . Not on file  Tobacco Use    . Smoking status: Never Smoker  . Smokeless tobacco: Never Used  Substance and Sexual Activity  . Alcohol use: No  . Drug use: No  . Sexual activity: Never  Other Topics Concern  . Not on file  Social History Narrative  . Not on file   Social Determinants of Health   Financial Resource Strain:   . Difficulty of Paying Living Expenses:   Food Insecurity:   . Worried About Programme researcher, broadcasting/film/video in the Last Year:   . Barista in the Last Year:   Transportation Needs:   . Freight forwarder (Medical):   Marland Kitchen Lack of Transportation (Non-Medical):   Physical Activity:   . Days of Exercise per Week:   . Minutes of Exercise per Session:   Stress:   . Feeling of Stress :   Social Connections:   . Frequency of Communication with Friends and Family:   . Frequency of Social Gatherings with Friends and Family:   . Attends Religious Services:   . Active Member of Clubs or Organizations:   . Attends Banker Meetings:   Marland Kitchen Marital Status:   Intimate Partner Violence:   . Fear of Current or Ex-Partner:   . Emotionally Abused:   Marland Kitchen Physically Abused:   . Sexually Abused:    Family History  Problem Relation Age of Onset  . Eczema Mother   . Allergic rhinitis Mother   . Angioedema Neg Hx   . Asthma Neg Hx   . Immunodeficiency Neg Hx   . Urticaria Neg Hx     Objective: Office vital signs reviewed. BP 133/86   Pulse (!) 117   Temp 99.4 F (37.4 C) (Temporal)   Ht 5' (1.524 m)   Wt 204 lb (92.5 kg)   LMP 12/04/2019 (Exact Date)   SpO2 98%   BMI 39.84 kg/m   Physical Examination:  General: Awake, alert, well nourished, No acute distress HEENT: Normal, sclera white, MMM; left tonsil slightly enlarged compared to right.  No erythema.  There are 2 tonsil stones noted within the left side. Cardio: regular rate and rhythm, S1S2 heard, no murmurs appreciated Pulm: clear to auscultation bilaterally, no wheezes, rhonchi or rales; normal work of breathing on room  air Extremities: warm, well perfused, No edema, cyanosis or clubbing; +2 pulses bilaterally Psych: Mood stable, speech normal, affect appropriate, pleasant and interactive.  Assessment/ Plan: 19 y.o. female   1. Intrinsic eczema Well-controlled with current therapies.  Eucrisa renewed. - Crisaborole (EUCRISA) 2 % OINT; Apply 1 application topically 2 (two) times daily.  Dispense: 60 g; Refill: 1  2. Gastroesophageal reflux disease without esophagitis Well-controlled with omeprazole. - omeprazole (PRILOSEC) 20 MG capsule; Take 1 capsule (20 mg total) by mouth daily.  Dispense: 90 capsule; Refill: 3  3. Tonsil stone No evidence of secondary infection today.  I suspect that this is the etiology of her tonsillar enlargement.  I have offered referral to ENT should she decide to pursue this.  We discussed that snoring may indicate apneic symptoms but she was not wanting to see a specialist just yet.  4. Mild intermittent chronic asthma without complication Would not consider her severe given rare use of albuterol no need for controller.  I have sent in renewals in both the albuterol inhaler and Singulair.  She will follow-up in 6 months for her full annual physical with fasting labs and first Pap smear. - albuterol (VENTOLIN HFA) 108 (90 Base) MCG/ACT inhaler; INHALE 2 PUFFS BY MOUTH EVERY 4 TO 6 HOURS AS NEEDED FOR COUGH AND FOR WHEEZING  Dispense: 18 g; Refill: 1 - montelukast (SINGULAIR) 10 MG tablet; Take 1 tablet (10 mg total) by mouth every evening.  Dispense: 90 tablet; Refill: 3    No orders of the defined types were placed in this encounter.  No orders of the defined types were placed in this encounter.    Raliegh Ip, DO [facility] [phone]

## 2020-03-04 ENCOUNTER — Other Ambulatory Visit: Payer: Self-pay

## 2020-03-04 ENCOUNTER — Ambulatory Visit
Admission: EM | Admit: 2020-03-04 | Discharge: 2020-03-04 | Disposition: A | Discharge status: Home / Self Care | Payer: Managed Care, Other (non HMO)

## 2020-03-04 DIAGNOSIS — J358 Other chronic diseases of tonsils and adenoids: Secondary | ICD-10-CM

## 2020-03-04 NOTE — Discharge Instructions (Addendum)
Follow up with ENT for further evaluation of left tonsil  Follow up with this office as needed

## 2020-03-04 NOTE — ED Triage Notes (Signed)
Pt presents with c/o enlarged tonsil that is partially ripped.. denies pain

## 2020-03-04 NOTE — ED Provider Notes (Signed)
[place]   [id number] 03/04/20 Arrival Time: 3536  RW:ERXV THROAT  SUBJECTIVE: History from: patient.  Casey Weaver is a 20 y.o. female who presents with abrupt onset of sore throat for the last hour. Reports that she felt something in her throat while she was eating about an hour ago. Reports that she saw something coming out of her tonsil and she got worried about this. Denies pain, exudate. Reports foreign body sensation in her throat when she swallows. Denies to sick exposure to Covid, strep, flu or mono, or precipitating event. Has not tried OTC medications for this. Symptoms are made worse with swallowing, but tolerating liquids and own secretions without difficulty. Denies previous symptoms in the past.   Denies fever, chills, fatigue, ear pain, sinus pain, rhinorrhea, nasal congestion, cough, SOB, wheezing, chest pain, nausea, rash, changes in bowel or bladder habits.     ROS: As per HPI.  All other pertinent ROS negative.     Past Medical History:  Diagnosis Date  . Allergic rhinoconjunctivitis   . Asthma   . Attention deficit disorder (ADD)   . Eczema   . Food allergy    Past Surgical History:  Procedure Laterality Date  . NO PAST SURGERIES     Allergies  Allergen Reactions  . Peanut-Containing Drug Products Swelling    PEANUT BUTTER: throat swells  . Septra [Sulfamethoxazole-Trimethoprim] Rash   No current facility-administered medications on file prior to encounter.   Current Outpatient Medications on File Prior to Encounter  Medication Sig Dispense Refill  . albuterol (PROVENTIL) (2.5 MG/3ML) 0.083% nebulizer solution INHALE 1 VIAL VIA NEBULIZER EVERY 6 HOURS 375 mL 1  . albuterol (VENTOLIN HFA) 108 (90 Base) MCG/ACT inhaler INHALE 2 PUFFS BY MOUTH EVERY 4 TO 6 HOURS AS NEEDED FOR COUGH AND FOR WHEEZING 18 g 1  . Crisaborole (EUCRISA) 2 % OINT Apply 1 application topically 2 (two) times daily. 60 g 1  . DUPIXENT 300 MG/2ML prefilled syringe INJECT  300MG  (1 SYRINGE) SUBCUTANEOUSLY EVERY OTHER WEEK. 4 mL 11  . EPINEPHrine (AUVI-Q) 0.3 mg/0.3 mL IJ SOAJ injection Use as directed for severe allergic reaction 2 Device 1  . montelukast (SINGULAIR) 10 MG tablet Take 1 tablet (10 mg total) by mouth every evening. 90 tablet 3  . omeprazole (PRILOSEC) 20 MG capsule Take 1 capsule (20 mg total) by mouth daily. 90 capsule 3   Social History   Socioeconomic History  . Marital status: Single    Spouse name: Not on file  . Number of children: Not on file  . Years of education: Not on file  . Highest education level: Not on file  Occupational History  . Not on file  Tobacco Use  . Smoking status: Never Smoker  . Smokeless tobacco: Never Used  Vaping Use  . Vaping Use: Never used  Substance and Sexual Activity  . Alcohol use: No  . Drug use: No  . Sexual activity: Not Currently  Other Topics Concern  . Not on file  Social History Narrative  . Not on file   Social Determinants of Health   Financial Resource Strain:   . Difficulty of Paying Living Expenses:   Food Insecurity:   . Worried About Charity fundraiser in the Last Year:   . Arboriculturist in the Last Year:   Transportation Needs:   . Film/video editor (Medical):   Marland Kitchen Lack of Transportation (Non-Medical):   Physical Activity:   . Days  of Exercise per Week:   . Minutes of Exercise per Session:   Stress:   . Feeling of Stress :   Social Connections:   . Frequency of Communication with Friends and Family:   . Frequency of Social Gatherings with Friends and Family:   . Attends Religious Services:   . Active Member of Clubs or Organizations:   . Attends Banker Meetings:   Marland Kitchen Marital Status:   Intimate Partner Violence:   . Fear of Current or Ex-Partner:   . Emotionally Abused:   Marland Kitchen Physically Abused:   . Sexually Abused:    Family History  Problem Relation Age of Onset  . Eczema Mother   . Allergic rhinitis Mother   . Angioedema Neg Hx   .  Asthma Neg Hx   . Immunodeficiency Neg Hx   . Urticaria Neg Hx     OBJECTIVE:  Vitals:   03/04/20 1428  BP: 124/89  Pulse: 89  Resp: 20  Temp: 97.9 F (36.6 C)  SpO2: 97%     General appearance: alert; appears fatigued, but nontoxic, speaking in full sentences and managing own secretions HEENT: NCAT; Ears: EACs clear, TMs pearly gray with visible cone of light, without erythema; Eyes: PERRL, EOMI grossly; Nose: no obvious rhinorrhea;  Throat: uvula midline, left tonsil with protruding tissue outward from the tonsil, no exudates Neck: supple without LAD Lungs: CTA bilaterally without adventitious breath sounds; cough absent Heart: regular rate and rhythm.  Radial pulses 2+ symmetrical bilaterally Skin: warm and dry Psychological: alert and cooperative; normal mood and affect  LABS: No results found for this or any previous visit (from the past 24 hour(s)).   ASSESSMENT & PLAN:  1. Tonsillar mass    Tonsillar Mass  Tissue protrusion from the left tonsil  Possibly traumatic from eating and not chewing food thoroughly Drink warm or cool liquids, use throat lozenges, or popsicles to help alleviate symptoms Take OTC ibuprofen or tylenol as needed for pain Follow up with ENT if symptoms persists May need surgical repair or removal Return or go to ER if patient has any new or worsening symptoms such as fever, chills, nausea, vomiting, worsening sore throat, cough, abdominal pain, chest pain, changes in bowel or bladder habits  Reviewed expectations re: course of current medical issues. Questions answered. Outlined signs and symptoms indicating need for more acute intervention. Patient verbalized understanding. After Visit Summary given.          Moshe Cipro, NP 03/05/20 [phone]

## 2020-03-05 DIAGNOSIS — J359 Chronic disease of tonsils and adenoids, unspecified: Secondary | ICD-10-CM | POA: Insufficient documentation

## 2020-03-09 ENCOUNTER — Other Ambulatory Visit: Payer: Self-pay | Admitting: *Deleted

## 2020-03-10 ENCOUNTER — Ambulatory Visit (INDEPENDENT_AMBULATORY_CARE_PROVIDER_SITE_OTHER): Payer: Managed Care, Other (non HMO) | Admitting: Otolaryngology

## 2020-03-11 ENCOUNTER — Other Ambulatory Visit: Payer: Self-pay | Admitting: Family Medicine

## 2020-03-11 MED ORDER — LEVOCETIRIZINE DIHYDROCHLORIDE 5 MG PO TABS: 5.0000 mg | ORAL_TABLET | Freq: Every evening | ORAL | 3 refills | Status: DC

## 2020-03-11 NOTE — Telephone Encounter (Signed)
Refill sent to pharmacy.   

## 2020-03-11 NOTE — Telephone Encounter (Signed)
  Prescription Request  03/11/2020  What is the name of the medication or equipment? levocetirizine (XYZAL) 5 MG tablet    Have you contacted your pharmacy to request a refill? (if applicable) yes  Which pharmacy would you like this sent to? [facility] in [place]   Patient notified that their request is being sent to the clinical staff for review and that they should receive a response within 2 business days.

## 2020-04-22 NOTE — Patient Instructions (Signed)
Severe atopic dermatitis Continue Dupixent injections every 14 days at home Continue triamcinolone 0.1% cream - use 1 application twice a day to red itchy areas below the neck Continue daily moisturization  Allergic rhinoconjunctivitis Continue Xyzal 5 mg once a day as needed for runny nose and itching Continue montelukast 10 mg once a day  Moderate persistent asthma Continue Ventolin 2 puffs every 4-6 hours as needed for cough, wheeze, tightness in chest, or shortness of breath. May also use albuterol 2 puffs 5-15 minutes prior to exercise For asthma flares continue: Symbicort 160/4.5 - 2 puffs twice a day with spacer for 1-2 weeks Asthma control goals:   Full participation in all desired activities (may need albuterol before activity)  Albuterol use two time or less a week on average (not counting use with activity)  Cough interfering with sleep two time or less a month  Oral steroids no more than once a year  No hospitalizations  Anaphylactic reaction due to food Avoid peanuts and tree nuts. In case of an allergic reaction, give Benadryl 4 teaspoonfuls every 4 hours, and if life-threatening symptoms occur, inject with AuviQ 0.3 mg.  Please let us know if this treatment plan is not working well for you Schedule follow up appointment in 6 months

## 2020-04-23 ENCOUNTER — Other Ambulatory Visit: Payer: Self-pay

## 2020-04-23 ENCOUNTER — Ambulatory Visit (INDEPENDENT_AMBULATORY_CARE_PROVIDER_SITE_OTHER): Payer: No Typology Code available for payment source | Admitting: Family

## 2020-04-23 ENCOUNTER — Encounter: Payer: Self-pay | Admitting: Family

## 2020-04-23 VITALS — BP 138/76 | HR 98 | Resp 18 | Ht 60.0 in | Wt 192.6 lb

## 2020-04-23 DIAGNOSIS — H1010 Acute atopic conjunctivitis, unspecified eye: Secondary | ICD-10-CM

## 2020-04-23 DIAGNOSIS — T7800XD Anaphylactic reaction due to unspecified food, subsequent encounter: Secondary | ICD-10-CM | POA: Diagnosis not present

## 2020-04-23 DIAGNOSIS — D7219 Other eosinophilia: Secondary | ICD-10-CM

## 2020-04-23 DIAGNOSIS — J309 Allergic rhinitis, unspecified: Secondary | ICD-10-CM | POA: Diagnosis not present

## 2020-04-23 DIAGNOSIS — J4540 Moderate persistent asthma, uncomplicated: Secondary | ICD-10-CM | POA: Diagnosis not present

## 2020-04-23 DIAGNOSIS — L2089 Other atopic dermatitis: Secondary | ICD-10-CM

## 2020-04-23 MED ORDER — EPINEPHRINE 0.3 MG/0.3ML IJ SOAJ: INTRAMUSCULAR | 1 refills | Status: DC

## 2020-04-23 MED ORDER — ALBUTEROL SULFATE HFA 108 (90 BASE) MCG/ACT IN AERS: INHALATION_SPRAY | RESPIRATORY_TRACT | 1 refills | Status: DC

## 2020-04-23 MED ORDER — MONTELUKAST SODIUM 10 MG PO TABS: 10.0000 mg | ORAL_TABLET | Freq: Every evening | ORAL | 5 refills | Status: DC

## 2020-04-23 MED ORDER — EUCRISA 2 % EX OINT: 1.0000 "application " | TOPICAL_OINTMENT | Freq: Two times a day (BID) | CUTANEOUS | 1 refills | Status: DC

## 2020-04-23 MED ORDER — BUDESONIDE-FORMOTEROL FUMARATE 160-4.5 MCG/ACT IN AERO
2.0000 | INHALATION_SPRAY | Freq: Two times a day (BID) | RESPIRATORY_TRACT | 5 refills | Status: DC
Start: 2020-04-23 — End: 2021-04-07

## 2020-04-23 MED ORDER — LEVOCETIRIZINE DIHYDROCHLORIDE 5 MG PO TABS: 5.0000 mg | ORAL_TABLET | Freq: Every evening | ORAL | 5 refills | Status: DC

## 2020-04-23 NOTE — Progress Notes (Signed)
[address] Casey Weaver [place] [address] Dept: [phone]  FOLLOW UP NOTE  Patient ID: Casey Weaver, female    DOB: [birth year]  Age: 19 y.o. MRN: [id number] Date of Office Visit: 04/23/2020  Assessment  Chief Complaint: Asthma, Eczema, and Food Intolerance (Peanuts, tree nuts)  HPI [name] is a 19 year old female who presents today for follow-up of severe atopic dermatitis, allergic rhinoconjunctivitis, moderate persistent asthma, anaphylactic shock due to food, and eosinophilia.  She was last seen on Jan 28, 2019 by Dr. Lucie Leather.  Severe atopic dermatitis is reported as well controlled with dupilumab injections every 2 weeks at home.  She reports rare use of triamcinolone 0.1% cream and has not had to use [place].  She reports that she moisturizes daily with lotion.  Moderate persistent asthma is reported as moderately controlled with Ventolin as needed.  She reports that she is not had to use her Symbicort 160/4.5 in awhile.  She denies any coughing, wheezing, tightness in her chest, shortness of breath, and nocturnal awakenings.  Since her last office visit she has not required any trips to the emergency room or urgent care due to her asthma and she has not required any systemic steroids.  She reports that she uses her albuterol approximately a couple times a week.  Allergic rhinoconjunctivitis is reported as controlled with the use of Xyzal 5 mg once a day and montelukast 10 mg once a day.  She reports today that she has had a little bit of clear rhinorrhea due to being around her boyfriend's dog, but otherwise denies any nasal congestion, postnasal drip, and itchy watery eyes.  She reports that approximately 3 months ago her mom made an Casey Weaver that was dairy free and she took a bite of the sauce and her mouth and throat became numb and tingly and she was nauseous.  After looking at the bottle of the Alfredo sauce she noticed that it contained cashews.  She denied any  pruritus or shortness of breath.  She reports that she took Benadryl and this relieved her symptoms and did not have to use her Auvi-Q.  Otherwise she continues to avoid peanuts and tree nuts.  Current medications are as listed in the chart.   Drug Allergies:  Allergies  Allergen Reactions  . Peanut-Containing Drug Products Swelling    PEANUT BUTTER: throat swells  . Septra [Sulfamethoxazole-Trimethoprim] Rash    Review of Systems: Review of Systems  Constitutional: Negative for chills and fever.  HENT: Negative for congestion, nosebleeds, sinus pain and sore throat.        Reports clear rhinorrhea  Eyes:       Denies itchy watery eyes  Respiratory: Negative for cough, shortness of breath and wheezing.   Cardiovascular: Negative for chest pain and palpitations.  Gastrointestinal: Negative for abdominal pain and heartburn.  Genitourinary: Negative for dysuria.  Skin: Negative for itching and rash.  Neurological: Negative for headaches.  Endo/Heme/Allergies: Positive for environmental allergies.     Physical Exam: BP 138/76   Pulse 98   Resp 18   Ht 5' (1.524 m)   Wt 192 lb 9.6 oz (87.4 kg)   SpO2 98%   BMI 37.61 kg/m    Physical Exam Constitutional:      Appearance: Normal appearance.  HENT:     Head: Normocephalic and atraumatic.     Comments: Pharynx normal. Eyes normal. Ears normal. Nose normal    Right Ear: Tympanic membrane, ear canal and external ear normal.  Nose: Nose normal.     Mouth/Throat:     Mouth: Mucous membranes are moist.     Pharynx: Oropharynx is clear.  Eyes:     Conjunctiva/sclera: Conjunctivae normal.  Cardiovascular:     Rate and Rhythm: Regular rhythm.  Pulmonary:     Effort: Pulmonary effort is normal.     Breath sounds: Normal breath sounds.     Comments: Lungs clear to auscultation Musculoskeletal:     Cervical back: Neck supple.  Skin:    General: Skin is warm.     Comments: No eczematous patches noted  Neurological:      Mental Status: She is alert and oriented to person, place, and time.  Psychiatric:        Mood and Affect: Mood normal.        Behavior: Behavior normal.        Thought Content: Thought content normal.        Judgment: Judgment normal.     Diagnostics: FVC 3.89 L, FEV1 3.08 L.  Predicted FVC 3.29 L, FEV1 2.93 L.  Spirometry indicates normal ventilatory function.  Assessment and Plan: 1. Asthma, moderate persistent, well-controlled   2. Other atopic dermatitis   3. Allergic rhinoconjunctivitis   4. Anaphylactic shock due to food, subsequent encounter   5. Eosinophilic leukocytosis, unspecified type     Meds ordered this encounter  Medications  . EPINEPHrine (AUVI-Q) 0.3 mg/0.3 mL IJ SOAJ injection    Sig: Use as directed for severe allergic reaction    Dispense:  2 each    Refill:  1  . albuterol (VENTOLIN HFA) 108 (90 Base) MCG/ACT inhaler    Sig: INHALE 2 PUFFS BY MOUTH EVERY 4 TO 6 HOURS AS NEEDED FOR COUGH AND FOR WHEEZING    Dispense:  18 g    Refill:  1  . Crisaborole (EUCRISA) 2 % OINT    Sig: Apply 1 application topically 2 (two) times daily.    Dispense:  60 g    Refill:  1  . levocetirizine (XYZAL) 5 MG tablet    Sig: Take 1 tablet (5 mg total) by mouth every evening.    Dispense:  30 tablet    Refill:  5  . montelukast (SINGULAIR) 10 MG tablet    Sig: Take 1 tablet (10 mg total) by mouth every evening.    Dispense:  30 tablet    Refill:  5  . budesonide-formoterol (SYMBICORT) 160-4.5 MCG/ACT inhaler    Sig: Inhale 2 puffs into the lungs 2 (two) times daily.    Dispense:  10.2 g    Refill:  5    Patient Instructions  Severe atopic dermatitis Continue Dupixent injections every 14 days at home Continue triamcinolone 0.1% cream - use 1 application twice a day to red itchy areas below the neck Continue daily moisturization  Allergic rhinoconjunctivitis Continue Xyzal 5 mg once a day as needed for runny nose and itching Continue montelukast 10 mg once a  day  Moderate persistent asthma Continue Ventolin 2 puffs every 4-6 hours as needed for cough, wheeze, tightness in chest, or shortness of breath. May also use albuterol 2 puffs 5-15 minutes prior to exercise For asthma flares continue: Symbicort 160/4.5 - 2 puffs twice a day with spacer for 1-2 weeks Asthma control goals:   Full participation in all desired activities (may need albuterol before activity)  Albuterol use two time or less a week on average (not counting use with activity)  Cough interfering with  sleep two time or less a month  Oral steroids no more than once a year  No hospitalizations  Anaphylactic reaction due to food Avoid peanuts and tree nuts. In case of an allergic reaction, give Benadryl 4 teaspoonfuls every 4 hours, and if life-threatening symptoms occur, inject with AuviQ 0.3 mg.  Please let us know if this treatment plan is not working well for you Schedule follow up appointment in 6 months   Return in about 6 months (around 10/24/2020), or if symptoms worsen or fail to improve.    Thank you for the opportunity to care for this patient.  Please do not hesitate to contact me with questions.  Nehemiah Settle, FNP [facility]

## 2020-06-23 ENCOUNTER — Telehealth: Payer: Self-pay | Admitting: *Deleted

## 2020-06-23 ENCOUNTER — Other Ambulatory Visit: Payer: Self-pay

## 2020-06-23 ENCOUNTER — Encounter (HOSPITAL_COMMUNITY): Payer: Self-pay | Admitting: Obstetrics and Gynecology

## 2020-06-23 ENCOUNTER — Encounter: Payer: Self-pay | Admitting: Family Medicine

## 2020-06-23 ENCOUNTER — Inpatient Hospital Stay (HOSPITAL_COMMUNITY): Payer: PRIVATE HEALTH INSURANCE

## 2020-06-23 ENCOUNTER — Inpatient Hospital Stay (HOSPITAL_COMMUNITY)
Admission: AD | Admit: 2020-06-23 | Discharge: 2020-06-23 | Disposition: A | Discharge status: Home / Self Care | Payer: PRIVATE HEALTH INSURANCE | Attending: Obstetrics and Gynecology | Admitting: Obstetrics and Gynecology

## 2020-06-23 ENCOUNTER — Ambulatory Visit (INDEPENDENT_AMBULATORY_CARE_PROVIDER_SITE_OTHER): Payer: PRIVATE HEALTH INSURANCE | Admitting: Family Medicine

## 2020-06-23 DIAGNOSIS — Z882 Allergy status to sulfonamides status: Secondary | ICD-10-CM | POA: Insufficient documentation

## 2020-06-23 DIAGNOSIS — O360110 Maternal care for anti-D [Rh] antibodies, first trimester, not applicable or unspecified: Secondary | ICD-10-CM

## 2020-06-23 DIAGNOSIS — O3680X0 Pregnancy with inconclusive fetal viability, not applicable or unspecified: Secondary | ICD-10-CM

## 2020-06-23 DIAGNOSIS — Z3A01 Less than 8 weeks gestation of pregnancy: Secondary | ICD-10-CM | POA: Insufficient documentation

## 2020-06-23 DIAGNOSIS — Z881 Allergy status to other antibiotic agents status: Secondary | ICD-10-CM | POA: Diagnosis not present

## 2020-06-23 DIAGNOSIS — R109 Unspecified abdominal pain: Secondary | ICD-10-CM | POA: Diagnosis not present

## 2020-06-23 DIAGNOSIS — Z7951 Long term (current) use of inhaled steroids: Secondary | ICD-10-CM | POA: Insufficient documentation

## 2020-06-23 DIAGNOSIS — J45909 Unspecified asthma, uncomplicated: Secondary | ICD-10-CM | POA: Insufficient documentation

## 2020-06-23 DIAGNOSIS — O99511 Diseases of the respiratory system complicating pregnancy, first trimester: Secondary | ICD-10-CM | POA: Insufficient documentation

## 2020-06-23 DIAGNOSIS — Z3201 Encounter for pregnancy test, result positive: Secondary | ICD-10-CM

## 2020-06-23 DIAGNOSIS — O4691 Antepartum hemorrhage, unspecified, first trimester: Secondary | ICD-10-CM

## 2020-06-23 DIAGNOSIS — O26899 Other specified pregnancy related conditions, unspecified trimester: Secondary | ICD-10-CM

## 2020-06-23 DIAGNOSIS — Z6791 Unspecified blood type, Rh negative: Secondary | ICD-10-CM | POA: Insufficient documentation

## 2020-06-23 DIAGNOSIS — O209 Hemorrhage in early pregnancy, unspecified: Secondary | ICD-10-CM | POA: Diagnosis not present

## 2020-06-23 DIAGNOSIS — Z79899 Other long term (current) drug therapy: Secondary | ICD-10-CM | POA: Insufficient documentation

## 2020-06-23 DIAGNOSIS — Z3491 Encounter for supervision of normal pregnancy, unspecified, first trimester: Secondary | ICD-10-CM

## 2020-06-23 DIAGNOSIS — O4690 Antepartum hemorrhage, unspecified, unspecified trimester: Secondary | ICD-10-CM

## 2020-06-23 LAB — URINALYSIS, ROUTINE W REFLEX MICROSCOPIC
Bilirubin Urine: NEGATIVE
Glucose, UA: NEGATIVE mg/dL
Ketones, ur: NEGATIVE mg/dL
Nitrite: NEGATIVE
Protein, ur: 100 mg/dL — AB
RBC / HPF: >50 RBC/hpf — ABNORMAL HIGH (ref 0–5)
Specific Gravity, Urine: 1.005 (ref 1.005–1.030)
pH: 6 (ref 5.0–8.0)

## 2020-06-23 LAB — CBC
HCT: 39.3 % (ref 36.0–46.0)
Hemoglobin: 12.7 g/dL (ref 12.0–15.0)
MCH: 28.6 pg (ref 26.0–34.0)
MCHC: 32.3 g/dL (ref 30.0–36.0)
MCV: 88.5 fL (ref 80.0–100.0)
Platelets: 319 10*3/uL (ref 150–400)
RBC: 4.44 MIL/uL (ref 3.87–5.11)
RDW: 12.5 % (ref 11.5–15.5)
WBC: 8.4 10*3/uL (ref 4.0–10.5)
nRBC: 0 % (ref 0.0–0.2)

## 2020-06-23 LAB — COMPREHENSIVE METABOLIC PANEL
ALT: 13 U/L (ref 0–44)
AST: 15 U/L (ref 15–41)
Albumin: 3.8 g/dL (ref 3.5–5.0)
Alkaline Phosphatase: 59 U/L (ref 38–126)
Anion gap: 6 (ref 5–15)
BUN: 9 mg/dL (ref 6–20)
CO2: 25 mmol/L (ref 22–32)
Calcium: 9.1 mg/dL (ref 8.9–10.3)
Chloride: 106 mmol/L (ref 98–111)
Creatinine, Ser: 0.68 mg/dL (ref 0.44–1.00)
GFR calc non Af Amer: >60 mL/min (ref >60)
Glucose, Bld: 96 mg/dL (ref 70–99)
Potassium: 3.9 mmol/L (ref 3.5–5.1)
Sodium: 137 mmol/L (ref 135–145)
Total Bilirubin: 0.3 mg/dL (ref 0.3–1.2)
Total Protein: 6.8 g/dL (ref 6.5–8.1)

## 2020-06-23 LAB — WET PREP, GENITAL
Clue Cells Wet Prep HPF POC: NONE SEEN
Sperm: NONE SEEN
Trich, Wet Prep: NONE SEEN
Yeast Wet Prep HPF POC: NONE SEEN

## 2020-06-23 LAB — ABO/RH: ABO/RH(D): O NEG

## 2020-06-23 LAB — HCG, QUANTITATIVE, PREGNANCY: hCG, Beta Chain, Quant, S: 57 m[IU]/mL — ABNORMAL HIGH (ref <5)

## 2020-06-23 LAB — POCT PREGNANCY, URINE: Preg Test, Ur: POSITIVE — AB

## 2020-06-23 MED ORDER — RHO D IMMUNE GLOBULIN 1500 UNIT/2ML IJ SOSY
300.0000 ug | PREFILLED_SYRINGE | Freq: Once | INTRAMUSCULAR | Status: AC
  Administered 2020-06-23: 300 ug via INTRAMUSCULAR
  Filled 2020-06-23: qty 2

## 2020-06-23 NOTE — Discharge Instructions (Signed)
Ectopic Pregnancy ° °An ectopic pregnancy is when the fertilized egg attaches (implants) outside the uterus. Most ectopic pregnancies occur in one of the tubes where eggs travel from the ovary to the uterus (fallopian tubes), but the implanting can occur in other locations. In rare cases, ectopic pregnancies occur on the ovary, intestine, pelvis, abdomen, or cervix. In an ectopic pregnancy, the fertilized egg does not have the ability to develop into a normal, healthy baby. °A ruptured ectopic pregnancy is one in which tearing or bursting of a fallopian tube causes internal bleeding. Often, there is intense lower abdominal pain, and vaginal bleeding sometimes occurs. Having an ectopic pregnancy can be life-threatening. If this dangerous condition is not treated, it can lead to blood loss, shock, or even death. °What are the causes? °The most common cause of this condition is damage to one of the fallopian tubes. A fallopian tube may be narrowed or blocked, and that keeps the fertilized egg from reaching the uterus. °What increases the risk? °This condition is more likely to develop in women of childbearing age who have different levels of risk. The levels of risk can be divided into three categories. °High risk °· You have gone through infertility treatment. °· You have had an ectopic pregnancy before. °· You have had surgery on the fallopian tubes, or another surgical procedure, such as an abortion. °· You have had surgery to have the fallopian tubes tied (tubal ligation). °· You have problems or diseases of the fallopian tubes. °· You have been exposed to diethylstilbestrol (DES). This medicine was used until 1971, and it had effects on babies whose mothers took the medicine. °· You become pregnant while using an IUD (intrauterine device) for birth control. °Moderate risk °· You have a history of infertility. °· You have had an STI (sexually transmitted infection). °· You have a history of pelvic inflammatory  disease (PID). °· You have scarring from endometriosis. °· You have multiple sexual partners. °· You smoke. °Low risk °· You have had pelvic surgery. °· You use vaginal douches. °· You became sexually active before age 18. °What are the signs or symptoms? °Common symptoms of this condition include normal pregnancy symptoms, such as missing a period, nausea, tiredness, abdominal pain, breast tenderness, and bleeding. However, ectopic pregnancy will have additional symptoms, such as: °· Pain with intercourse. °· Irregular vaginal bleeding or spotting. °· Cramping or pain on one side or in the lower abdomen. °· Fast heartbeat, low blood pressure, and sweating. °· Passing out while having a bowel movement. °Symptoms of a ruptured ectopic pregnancy and internal bleeding may include: °· Sudden, severe pain in the abdomen and pelvis. °· Dizziness, weakness, light-headedness, or fainting. °· Pain in the shoulder or neck area. °How is this diagnosed? °This condition is diagnosed by: °· A pelvic exam to locate pain or a mass in the abdomen. °· A pregnancy test. This blood test checks for the presence as well as the specific level of pregnancy hormone in the bloodstream. °· Ultrasound. This is performed if a pregnancy test is positive. In this test, a probe is inserted into the vagina. The probe will detect a fetus, possibly in a location other than the uterus. °· Taking a sample of uterus tissue (dilation and curettage, or D&C). °· Surgery to perform a visual exam of the inside of the abdomen using a thin, lighted tube that has a tiny camera on the end (laparoscope). °· Culdocentesis. This procedure involves inserting a needle at the top of   the vagina, behind the uterus. If blood is present in this area, it may indicate that a fallopian tube is torn. How is this treated? This condition is treated with medicine or surgery. Medicine  An injection of a medicine (methotrexate) may be given to cause the pregnancy tissue to be  absorbed. This medicine may save your fallopian tube. It may be given if: ? The diagnosis is made early, with no signs of active bleeding. ? The fallopian tube has not ruptured. ? You are considered to be a good candidate for the medicine. Usually, pregnancy hormone blood levels are checked after methotrexate treatment. This is to be sure that the medicine is effective. It may take 4-6 weeks for the pregnancy to be absorbed. Most pregnancies will be absorbed by 3 weeks. Surgery  A laparoscope may be used to remove the pregnancy tissue.  If severe internal bleeding occurs, a larger cut (incision) may be made in the lower abdomen (laparotomy) to remove the fetus and placenta. This is done to stop the bleeding.  Part or all of the fallopian tube may be removed (salpingectomy) along with the fetus and placenta. The fallopian tube may also be repaired during the surgery.  In very rare circumstances, removal of the uterus (hysterectomy) may be required.  After surgery, pregnancy hormone testing may be done to be sure that there is no pregnancy tissue left. Whether your treatment is medicine or surgery, you may receive a Rho (D) immune globulin shot to prevent problems with any future pregnancy. This shot may be given if:  You are Rh-negative and the baby's father is Rh-positive.  You are Rh-negative and you do not know the Rh type of the baby's father. Follow these instructions at home:  Rest and limit your activity after the procedure for as long as told by your health care provider.  Until your health care provider says that it is safe: ? Do not lift anything that is heavier than 10 lb (4.5 kg), or the limit that your health care provider tells you. ? Avoid physical exercise and any movement that requires effort (is strenuous).  To help prevent constipation: ? Eat a healthy diet that includes fruits, vegetables, and whole grains. ? Drink 6-8 glasses of water per day. Get help right away  if:  You develop worsening pain that is not relieved by medicine.  You have: ? A fever or chills. ? Vaginal bleeding. ? Redness and swelling at the incision site. ? Nausea and vomiting.  You feel dizzy or weak.  You feel light-headed or you faint. This information is not intended to replace advice given to you by your health care provider. Make sure you discuss any questions you have with your health care provider. Document Revised: 08/17/2017 Document Reviewed: 04/05/2016 Elsevier Patient Education  2020 [employer]        Miscarriage A miscarriage is the loss of an unborn baby (fetus) before the 20th week of pregnancy. Most miscarriages happen during the first 3 months of pregnancy. Sometimes, a miscarriage can happen before a woman knows that she is pregnant. Having a miscarriage can be an emotional experience. If you have had a miscarriage, talk with your health care provider about any questions you may have about miscarrying, the grieving process, and your plans for future pregnancy. What are the causes? A miscarriage may be caused by:  Problems with the genes or chromosomes of the fetus. These problems make it impossible for the baby to develop normally. They are   often the result of random errors that occur early in the development of the baby, and are not passed from parent to child (not inherited).  Infection of the cervix or uterus.  Conditions that affect hormone balance in the body.  Problems with the cervix, such as the cervix opening and thinning before pregnancy is at term (cervical insufficiency).  Problems with the uterus. These may include: ? A uterus with an abnormal shape. ? Fibroids in the uterus. ? Congenital abnormalities. These are problems that were present at birth.  Certain medical conditions.  Smoking, drinking alcohol, or using drugs.  Injury (trauma). In many cases, the cause of a miscarriage is not known. What are the signs or  symptoms? Symptoms of this condition include:  Vaginal bleeding or spotting, with or without cramps or pain.  Pain or cramping in the abdomen or lower back.  Passing fluid, tissue, or blood clots from the vagina. How is this diagnosed? This condition may be diagnosed based on:  A physical exam.  Ultrasound.  Blood tests.  Urine tests. How is this treated? Treatment for a miscarriage is sometimes not necessary if you naturally pass all the tissue that was in your uterus. If necessary, this condition may be treated with:  Dilation and curettage (D&C). This is a procedure in which the cervix is stretched open and the lining of the uterus (endometrium) is scraped. This is done only if tissue from the fetus or placenta remains in the body (incomplete miscarriage).  Medicines, such as: ? Antibiotic medicine, to treat infection. ? Medicine to help the body pass any remaining tissue. ? Medicine to reduce (contract) the size of the uterus. These medicines may be given if you have a lot of bleeding. If you have Rh negative blood and your baby was Rh positive, you will need a shot of a medicine called Rh immunoglobulinto protect your future babies from Rh blood problems. "Rh-negative" and "Rh-positive" refer to whether or not the blood has a specific protein found on the surface of red blood cells (Rh factor). Follow these instructions at home: Medicines   Take over-the-counter and prescription medicines only as told by your health care provider.  If you were prescribed antibiotic medicine, take it as told by your health care provider. Do not stop taking the antibiotic even if you start to feel better.  Do not take NSAIDs, such as aspirin and ibuprofen, unless they are approved by your health care provider. These medicines can cause bleeding. Activity  Rest as directed. Ask your health care provider what activities are safe for you.  Have someone help with home and family  responsibilities during this time. General instructions  Keep track of the number of sanitary pads you use each day and how soaked (saturated) they are. Write down this information.  Monitor the amount of tissue or blood clots that you pass from your vagina. Save any large amounts of tissue for your health care provider to examine.  Do not use tampons, douche, or have sex until your health care provider approves.  To help you and your partner with the process of grieving, talk with your health care provider or seek counseling.  When you are ready, meet with your health care provider to discuss any important steps you should take for your health. Also, discuss steps you should take to have a healthy pregnancy in the future.  Keep all follow-up visits as told by your health care provider. This is important. Where to find more   information  The [employer]nec[employer]: www.acog.org  [place] Department of Health and Human Services Office of Women's Health: www.womenshealth.gov Contact a health care provider if:  You have a fever or chills.  You have a foul smelling vaginal discharge.  You have more bleeding instead of less. Get help right away if:  You have severe cramps or pain in your back or abdomen.  You pass blood clots or tissue from your vagina that is walnut-sized or larger.  You soak more than 1 regular sanitary pad in an hour.  You become light-headed or weak.  You pass out.  You have feelings of sadness that take over your thoughts, or you have thoughts of hurting yourself. Summary  Most miscarriages happen in the first 3 months of pregnancy. Sometimes miscarriage happens before a woman even knows that she is pregnant.  Follow your health care provider's instruction for home care. Keep all follow-up appointments.  To help you and your partner with the process of grieving, talk with your health care provider or seek counseling. This  information is not intended to replace advice given to you by your health care provider. Make sure you discuss any questions you have with your health care provider. Document Revised: 12/27/2018 Document Reviewed: 10/10/2016 Elsevier Patient Education  2020 [employer]        First Trimester of Pregnancy The first trimester of pregnancy is from week 1 until the end of week 13 (months 1 through 3). A week after a sperm fertilizes an egg, the egg will implant on the wall of the uterus. This embryo will begin to develop into a baby. Genes from you and your partner will form the baby. The female genes will determine whether the baby will be a boy or a girl. At 6-8 weeks, the eyes and face will be formed, and the heartbeat can be seen on ultrasound. At the end of 12 weeks, all the baby's organs will be formed. Now that you are pregnant, you will want to do everything you can to have a healthy baby. Two of the most important things are to get good prenatal care and to follow your health care provider's instructions. Prenatal care is all the medical care you receive before the baby's birth. This care will help prevent, find, and treat any problems during the pregnancy and childbirth. Body changes during your first trimester Your body goes through many changes during pregnancy. The changes vary from woman to woman.  You may gain or lose a couple of pounds at first.  You may feel sick to your stomach (nauseous) and you may throw up (vomit). If the vomiting is uncontrollable, call your health care provider.  You may tire easily.  You may develop headaches that can be relieved by medicines. All medicines should be approved by your health care provider.  You may urinate more often. Painful urination may mean you have a bladder infection.  You may develop heartburn as a result of your pregnancy.  You may develop constipation because certain hormones are causing the muscles that push stool through  your intestines to slow down.  You may develop hemorrhoids or swollen veins (varicose veins).  Your breasts may begin to grow larger and become tender. Your nipples may stick out more, and the tissue that surrounds them (areola) may become darker.  Your gums may bleed and may be sensitive to brushing and flossing.  Dark spots or blotches (chloasma, mask of pregnancy) may develop on your   face. This will likely fade after the baby is born.  Your menstrual periods will stop.  You may have a loss of appetite.  You may develop cravings for certain kinds of food.  You may have changes in your emotions from day to day, such as being excited to be pregnant or being concerned that something may go wrong with the pregnancy and baby.  You may have more vivid and strange dreams.  You may have changes in your hair. These can include thickening of your hair, rapid growth, and changes in texture. Some women also have hair loss during or after pregnancy, or hair that feels dry or thin. Your hair will most likely return to normal after your baby is born. What to expect at prenatal visits During a routine prenatal visit:  You will be weighed to make sure you and the baby are growing normally.  Your blood pressure will be taken.  Your abdomen will be measured to track your baby's growth.  The fetal heartbeat will be listened to between weeks 10 and 14 of your pregnancy.  Test results from any previous visits will be discussed. Your health care provider may ask you:  How you are feeling.  If you are feeling the baby move.  If you have had any abnormal symptoms, such as leaking fluid, bleeding, severe headaches, or abdominal cramping.  If you are using any tobacco products, including cigarettes, chewing tobacco, and electronic cigarettes.  If you have any questions. Other tests that may be performed during your first trimester include:  Blood tests to find your blood type and to check for  the presence of any previous infections. The tests will also be used to check for low iron levels (anemia) and protein on red blood cells (Rh antibodies). Depending on your risk factors, or if you previously had diabetes during pregnancy, you may have tests to check for high blood sugar that affects pregnant women (gestational diabetes).  Urine tests to check for infections, diabetes, or protein in the urine.  An ultrasound to confirm the proper growth and development of the baby.  Fetal screens for spinal cord problems (spina bifida) and Down syndrome.  HIV (human immunodeficiency virus) testing. Routine prenatal testing includes screening for HIV, unless you choose not to have this test.  You may need other tests to make sure you and the baby are doing well. Follow these instructions at home: Medicines  Follow your health care provider's instructions regarding medicine use. Specific medicines may be either safe or unsafe to take during pregnancy.  Take a prenatal vitamin that contains at least 600 micrograms (mcg) of folic acid.  If you develop constipation, try taking a stool softener if your health care provider approves. Eating and drinking   Eat a balanced diet that includes fresh fruits and vegetables, whole grains, good sources of protein such as meat, eggs, or tofu, and low-fat dairy. Your health care provider will help you determine the amount of weight gain that is right for you.  Avoid raw meat and uncooked cheese. These carry germs that can cause birth defects in the baby.  Eating four or five small meals rather than three large meals a day may help relieve nausea and vomiting. If you start to feel nauseous, eating a few soda crackers can be helpful. Drinking liquids between meals, instead of during meals, also seems to help ease nausea and vomiting.  Limit foods that are high in fat and processed sugars, such as fried and   sweet foods.  To prevent constipation: ? Eat foods  that are high in fiber, such as fresh fruits and vegetables, whole grains, and beans. ? Drink enough fluid to keep your urine clear or pale yellow. Activity  Exercise only as directed by your health care provider. Most women can continue their usual exercise routine during pregnancy. Try to exercise for 30 minutes at least 5 days a week. Exercising will help you: ? Control your weight. ? Stay in shape. ? Be prepared for labor and delivery.  Experiencing pain or cramping in the lower abdomen or lower back is a good sign that you should stop exercising. Check with your health care provider before continuing with normal exercises.  Try to avoid standing for long periods of time. Move your legs often if you must stand in one place for a long time.  Avoid heavy lifting.  Wear low-heeled shoes and practice good posture.  You may continue to have sex unless your health care provider tells you not to. Relieving pain and discomfort  Wear a good support bra to relieve breast tenderness.  Take warm sitz baths to soothe any pain or discomfort caused by hemorrhoids. Use hemorrhoid cream if your health care provider approves.  Rest with your legs elevated if you have leg cramps or low back pain.  If you develop varicose veins in your legs, wear support hose. Elevate your feet for 15 minutes, 3-4 times a day. Limit salt in your diet. Prenatal care  Schedule your prenatal visits by the twelfth week of pregnancy. They are usually scheduled monthly at first, then more often in the last 2 months before delivery.  Write down your questions. Take them to your prenatal visits.  Keep all your prenatal visits as told by your health care provider. This is important. Safety  Wear your seat belt at all times when driving.  Make a list of emergency phone numbers, including numbers for family, friends, the hospital, and police and fire departments. General instructions  Ask your health care provider for  a referral to a local prenatal education class. Begin classes no later than the beginning of month 6 of your pregnancy.  Ask for help if you have counseling or nutritional needs during pregnancy. Your health care provider can offer advice or refer you to specialists for help with various needs.  Do not use hot tubs, steam rooms, or saunas.  Do not douche or use tampons or scented sanitary pads.  Do not cross your legs for long periods of time.  Avoid cat litter boxes and soil used by cats. These carry germs that can cause birth defects in the baby and possibly loss of the fetus by miscarriage or stillbirth.  Avoid all smoking, herbs, alcohol, and medicines not prescribed by your health care provider. Chemicals in these products affect the formation and growth of the baby.  Do not use any products that contain nicotine or tobacco, such as cigarettes and e-cigarettes. If you need help quitting, ask your health care provider. You may receive counseling support and other resources to help you quit.  Schedule a dentist appointment. At home, brush your teeth with a soft toothbrush and be gentle when you floss. Contact a health care provider if:  You have dizziness.  You have mild pelvic cramps, pelvic pressure, or nagging pain in the abdominal area.  You have persistent nausea, vomiting, or diarrhea.  You have a bad smelling vaginal discharge.  You have pain when you urinate.  You   notice increased swelling in your face, hands, legs, or ankles.  You are exposed to fifth disease or chickenpox.  You are exposed to German measles (rubella) and have never had it. Get help right away if:  You have a fever.  You are leaking fluid from your vagina.  You have spotting or bleeding from your vagina.  You have severe abdominal cramping or pain.  You have rapid weight gain or loss.  You vomit blood or material that looks like coffee grounds.  You develop a severe headache.  You have  shortness of breath.  You have any kind of trauma, such as from a fall or a car accident. Summary  The first trimester of pregnancy is from week 1 until the end of week 13 (months 1 through 3).  Your body goes through many changes during pregnancy. The changes vary from woman to woman.  You will have routine prenatal visits. During those visits, your health care provider will examine you, discuss any test results you may have, and talk with you about how you are feeling. This information is not intended to replace advice given to you by your health care provider. Make sure you discuss any questions you have with your health care provider. Document Revised: 08/17/2017 Document Reviewed: 08/16/2016 Elsevier Patient Education  2020 [employer]                        Safe Medications in Pregnancy    Acne: Benzoyl Peroxide Salicylic Acid  Backache/Headache: Tylenol: 2 regular strength every 4 hours OR              2 Extra strength every 6 hours  Colds/Coughs/Allergies: Benadryl (alcohol free) 25 mg every 6 hours as needed Breath right strips Claritin Cepacol throat lozenges Chloraseptic throat spray Cold-Eeze- up to three times per day Cough drops, alcohol free Flonase (by prescription only) Guaifenesin Mucinex Robitussin DM (plain only, alcohol free) Saline nasal spray/drops Sudafed (pseudoephedrine) & Actifed ** use only after [redacted] weeks gestation and if you do not have high blood pressure Tylenol Vicks Vaporub Zinc lozenges Zyrtec   Constipation: Colace Ducolax suppositories Fleet enema Glycerin suppositories Metamucil Milk of magnesia Miralax Senokot Smooth move tea  Diarrhea: Kaopectate Imodium A-D  *NO pepto Bismol  Hemorrhoids: Anusol Anusol HC Preparation H Tucks  Indigestion: Tums Maalox Mylanta Zantac  Pepcid  Insomnia: Benadryl (alcohol free) 25mg every 6 hours as needed Tylenol PM Unisom, no Gelcaps  Leg  Cramps: Tums MagGel  Nausea/Vomiting:  Bonine Dramamine Emetrol Ginger extract Sea bands Meclizine  Nausea medication to take during pregnancy:  Unisom (doxylamine succinate 25 mg tablets) Take one tablet daily at bedtime. If symptoms are not adequately controlled, the dose can be increased to a maximum recommended dose of two tablets daily (1/2 tablet in the morning, 1/2 tablet mid-afternoon and one at bedtime). Vitamin B6 100mg tablets. Take one tablet twice a day (up to 200 mg per day).  Skin Rashes: Aveeno products Benadryl cream or 25mg every 6 hours as needed Calamine Lotion 1% cortisone cream  Yeast infection: Gyne-lotrimin 7 Monistat 7   **If taking multiple medications, please check labels to avoid duplicating the same active ingredients **take medication as directed on the label ** Do not exceed 4000 mg of tylenol in 24 hours **Do not take medications that contain aspirin or ibuprofen          Prenatal Care Providers             [facility] - accepts patients without insurance  Phone: [phone]  [facility]   Phone: [phone]  [facility] [place]       Phone: [phone]            [facility] [place]     Phone: [phone]          [facility] [place]   Phone: [phone]  [facility] - accepts patients without insurance  Phone: [phone]  [facility] Phone: [phone]     [facility] - accepts patients without insurance Phone: [phone]  [facility]  Phone: [phone]  [facility] Phone: [phone]  [facility] Phone: [phone]  [facility] Phone: [phone]  [facility] Phone: [phone]  [facility]  Phone:  [phone]         

## 2020-06-23 NOTE — Progress Notes (Signed)
Virtual Visit via Telephone Note  I connected with Casey Weaver on 06/23/20 at 9:41 AM by telephone and verified that I am speaking with the correct person using two identifiers. Casey Weaver is currently located at home and nobody is currently with her during this visit. The provider, Gwenlyn Fudge, FNP is located in their home at time of visit.  I discussed the limitations, risks, security and privacy concerns of performing an evaluation and management service by telephone and the availability of in person appointments. I also discussed with the patient that there may be a patient responsible charge related to this service. The patient expressed understanding and agreed to proceed.  Subjective: PCP: Raliegh Ip, DO  Chief Complaint  Patient presents with  . Routine Prenatal Visit   Patient scheduled appointment due to positive pregnancy tests. Patient reports she has taken five tests and they are all immediately positive. Her last period was August 26-30th.    ROS: Per HPI  Current Outpatient Medications:  .  albuterol (PROVENTIL) (2.5 MG/3ML) 0.083% nebulizer solution, INHALE 1 VIAL VIA NEBULIZER EVERY 6 HOURS, Disp: 375 mL, Rfl: 1 .  albuterol (VENTOLIN HFA) 108 (90 Base) MCG/ACT inhaler, INHALE 2 PUFFS BY MOUTH EVERY 4 TO 6 HOURS AS NEEDED FOR COUGH AND FOR WHEEZING, Disp: 18 g, Rfl: 1 .  ascorbic acid (VITAMIN C) 100 MG tablet, Take by mouth., Disp: , Rfl:  .  budesonide-formoterol (SYMBICORT) 160-4.5 MCG/ACT inhaler, Inhale 2 puffs into the lungs 2 (two) times daily., Disp: 10.2 g, Rfl: 5 .  Crisaborole (EUCRISA) 2 % OINT, Apply 1 application topically 2 (two) times daily., Disp: 60 g, Rfl: 1 .  DUPIXENT 300 MG/2ML prefilled syringe, INJECT 300MG  (1 SYRINGE) SUBCUTANEOUSLY EVERY OTHER WEEK., Disp: 4 mL, Rfl: 11 .  EPINEPHrine (AUVI-Q) 0.3 mg/0.3 mL IJ SOAJ injection, Use as directed for severe allergic reaction, Disp: 2 each, Rfl: 1 .  ibuprofen (ADVIL) 800 MG tablet,  Take 1 tablet by mouth 3 (three) times daily., Disp: , Rfl:  .  levocetirizine (XYZAL) 5 MG tablet, Take 1 tablet (5 mg total) by mouth every evening., Disp: 30 tablet, Rfl: 5 .  montelukast (SINGULAIR) 10 MG tablet, Take 1 tablet (10 mg total) by mouth every evening., Disp: 30 tablet, Rfl: 5 .  omeprazole (PRILOSEC) 20 MG capsule, Take 1 capsule (20 mg total) by mouth daily., Disp: 90 capsule, Rfl: 3  Allergies  Allergen Reactions  . Peanut-Containing Drug Products Swelling    PEANUT BUTTER: throat swells  . Septra [Sulfamethoxazole-Trimethoprim] Rash   Past Medical History:  Diagnosis Date  . Allergic rhinoconjunctivitis   . Asthma   . Attention deficit disorder (ADD)   . Eczema   . Food allergy     Observations/Objective: A&O  No respiratory distress or wheezing audible over the phone Mood, judgement, and thought processes all WNL   Assessment and Plan: 1. First trimester pregnancy - Encouraged patient to start on prenatal vitamin. Call OBGYN to set up first OB appointment. She is going to use [facility]. Advised against NSAIDs and instructed to only use Tylenol for pain. Recommended she switch from Prilosec to Famotidine for acid reflux. She is going to hold of fon her Dupixent until she is seen by the OB. Also recommended calling her asthma and allergy provider.    Follow Up Instructions:  I discussed the assessment and treatment plan with the patient. The patient was provided an opportunity to ask questions  and all were answered. The patient agreed with the plan and demonstrated an understanding of the instructions.   The patient was advised to call back or seek an in-person evaluation if the symptoms worsen or if the condition fails to improve as anticipated.  The above assessment and management plan was discussed with the patient. The patient verbalized understanding of and has agreed to the management plan. Patient is aware to call the clinic if  symptoms persist or worsen. Patient is aware when to return to the clinic for a follow-up visit. Patient educated on when it is appropriate to go to the emergency department.   Time call ended: 9:51 AM  I provided 12 minutes of non-face-to-face time during this encounter.  Deliah Boston, MSN, APRN, FNP-C [facility] 06/23/20

## 2020-06-23 NOTE — Telephone Encounter (Signed)
Patient called stating that she just found out about being pregnant last Tuesday and is wondering about her Dupixent injections. Please advise.

## 2020-06-23 NOTE — Telephone Encounter (Signed)
Discussed with Dyan by phone. Presently considering option and pregnancy registrar

## 2020-06-23 NOTE — MAU Note (Signed)
Pt reports finding out she was pregnant on Tuesday last week.   Pt reports spotting on Tuesday, but read that it was normal.   Today pt reports to MAU with bleeding and cramping.

## 2020-06-23 NOTE — MAU Provider Note (Signed)
History     CSN: [id number]  Arrival date and time: 06/23/20 1453   First Provider Initiated Contact with Patient 06/23/20 1604      Chief Complaint  Patient presents with  . Abdominal Pain   Ms. Casey Weaver is a 19 y.o. G1P0 at [redacted]w[redacted]d who presents to MAU for vaginal bleeding which began around 230PM this afternoon. Patient reports she has not been wearing a pad for the bleeding.  Passing blood clots? no Blood soaking clothes? no Lightheaded/dizzy? no Significant pelvic pain or cramping? Mild menstrual-like cramping Passed any tissue? no Hx ectopic pregnancy? no  Current pregnancy problems? Pt has not yet been seen Blood Type? unknown Allergies? Sulfa, peanuts Current P[facility] & next appt? [facility], no appt scheduled  Pt denies vaginal discharge/odor/itching. Pt denies N/V, abdominal pain, constipation, diarrhea, or urinary problems. Pt denies fever, chills, fatigue, sweating or changes in appetite. Pt denies SOB or chest pain. Pt denies dizziness, HA, light-headedness, weakness.   OB History    Gravida  1   Para      Term      Preterm      AB      Living        SAB      TAB      Ectopic      Multiple      Live Births              Past Medical History:  Diagnosis Date  . Allergic rhinoconjunctivitis   . Asthma   . Attention deficit disorder (ADD)   . Eczema   . Food allergy     Past Surgical History:  Procedure Laterality Date  . NO PAST SURGERIES      Family History  Problem Relation Age of Onset  . Eczema Mother   . Allergic rhinitis Mother   . Angioedema Neg Hx   . Asthma Neg Hx   . Immunodeficiency Neg Hx   . Urticaria Neg Hx     Social History   Tobacco Use  . Smoking status: Never Smoker  . Smokeless tobacco: Never Used  Vaping Use  . Vaping Use: Never used  Substance Use Topics  . Alcohol use: No  . Drug use: No    Allergies:  Allergies  Allergen Reactions  . Peanut-Containing Drug Products Swelling     PEANUT BUTTER: throat swells  . Septra [Sulfamethoxazole-Trimethoprim] Rash    Medications Prior to Admission  Medication Sig Dispense Refill Last Dose  . albuterol (VENTOLIN HFA) 108 (90 Base) MCG/ACT inhaler INHALE 2 PUFFS BY MOUTH EVERY 4 TO 6 HOURS AS NEEDED FOR COUGH AND FOR WHEEZING 18 g 1 Past Month at Unknown time  . DUPIXENT 300 MG/2ML prefilled syringe INJECT 300MG  (1 SYRINGE) SUBCUTANEOUSLY EVERY OTHER WEEK. 4 mL 11 Past Month at Unknown time  . levocetirizine (XYZAL) 5 MG tablet Take 1 tablet (5 mg total) by mouth every evening. 30 tablet 5 06/23/2020 at Unknown time  . montelukast (SINGULAIR) 10 MG tablet Take 1 tablet (10 mg total) by mouth every evening. 30 tablet 5 06/23/2020 at Unknown time  . omeprazole (PRILOSEC) 20 MG capsule Take 1 capsule (20 mg total) by mouth daily. 90 capsule 3 Past Week at Unknown time  . albuterol (PROVENTIL) (2.5 MG/3ML) 0.083% nebulizer solution INHALE 1 VIAL VIA NEBULIZER EVERY 6 HOURS 375 mL 1 More than a month at Unknown time  . ascorbic acid (VITAMIN C) 100 MG tablet Take by mouth.     08/23/2020  budesonide-formoterol (SYMBICORT) 160-4.5 MCG/ACT inhaler Inhale 2 puffs into the lungs 2 (two) times daily. 10.2 g 5 More than a month at Unknown time  . Crisaborole (EUCRISA) 2 % OINT Apply 1 application topically 2 (two) times daily. 60 g 1   . EPINEPHrine (AUVI-Q) 0.3 mg/0.3 mL IJ SOAJ injection Use as directed for severe allergic reaction 2 each 1 More than a month at Unknown time    Review of Systems  Constitutional: Negative for chills, diaphoresis, fatigue and fever.  Eyes: Negative for visual disturbance.  Respiratory: Negative for shortness of breath.   Cardiovascular: Negative for chest pain.  Gastrointestinal: Negative for abdominal pain, constipation, diarrhea, nausea and vomiting.  Genitourinary: Positive for pelvic pain and vaginal bleeding. Negative for dysuria, flank pain, frequency, urgency and vaginal discharge.  Neurological: Negative  for dizziness, weakness, light-headedness and headaches.   Physical Exam   Blood pressure 130/71, pulse 97, temperature 98.5 F (36.9 C), temperature source Oral, resp. rate 20, last menstrual period 05/13/2020, SpO2 99 %.  Patient Vitals for the past 24 hrs:  BP Temp Temp src Pulse Resp SpO2  06/23/20 1516 130/71 98.5 F (36.9 C) Oral 97 20 99 %   Physical Exam Vitals and nursing note reviewed.  Constitutional:      General: She is not in acute distress.    Appearance: Normal appearance. She is not ill-appearing, toxic-appearing or diaphoretic.  HENT:     Head: Normocephalic and atraumatic.  Pulmonary:     Effort: Pulmonary effort is normal.  Neurological:     Mental Status: She is alert and oriented to person, place, and time.  Psychiatric:        Mood and Affect: Mood normal.        Behavior: Behavior normal.        Thought Content: Thought content normal.        Judgment: Judgment normal.    Results for orders placed or performed during the hospital encounter of 06/23/20 (from the past 24 hour(s))  Urinalysis, Routine w reflex microscopic Urine, Clean Catch     Status: Abnormal   Collection Time: 06/23/20  3:10 PM  Result Value Ref Range   Color, Urine AMBER (A) YELLOW   APPearance HAZY (A) CLEAR   Specific Gravity, Urine 1.005 1.005 - 1.030   pH 6.0 5.0 - 8.0   Glucose, UA NEGATIVE NEGATIVE mg/dL   Hgb urine dipstick MODERATE (A) NEGATIVE   Bilirubin Urine NEGATIVE NEGATIVE   Ketones, ur NEGATIVE NEGATIVE mg/dL   Protein, ur 161100 (A) NEGATIVE mg/dL   Nitrite NEGATIVE NEGATIVE   Leukocytes,Ua TRACE (A) NEGATIVE   RBC / HPF >50 (H) 0 - 5 RBC/hpf   WBC, UA 6-10 0 - 5 WBC/hpf   Bacteria, UA FEW (A) NONE SEEN  Pregnancy, urine POC     Status: Abnormal   Collection Time: 06/23/20  3:27 PM  Result Value Ref Range   Preg Test, Ur POSITIVE (A) NEGATIVE  CBC     Status: None   Collection Time: 06/23/20  4:04 PM  Result Value Ref Range   WBC 8.4 4.0 - 10.5 K/uL   RBC  4.44 3.87 - 5.11 MIL/uL   Hemoglobin 12.7 12.0 - 15.0 g/dL   HCT 09.639.3 36 - 46 %   MCV 88.5 80.0 - 100.0 fL   MCH 28.6 26.0 - 34.0 pg   MCHC 32.3 30.0 - 36.0 g/dL   RDW 04.512.5 40.911.5 - 81.115.5 %   Platelets 319 150 -  400 K/uL   nRBC 0.0 0.0 - 0.2 %  Comprehensive metabolic panel     Status: None   Collection Time: 06/23/20  4:04 PM  Result Value Ref Range   Sodium 137 135 - 145 mmol/L   Potassium 3.9 3.5 - 5.1 mmol/L   Chloride 106 98 - 111 mmol/L   CO2 25 22 - 32 mmol/L   Glucose, Bld 96 70 - 99 mg/dL   BUN 9 6 - 20 mg/dL   Creatinine, Ser 1.61 0.44 - 1.00 mg/dL   Calcium 9.1 8.9 - 09.6 mg/dL   Total Protein 6.8 6.5 - 8.1 g/dL   Albumin 3.8 3.5 - 5.0 g/dL   AST 15 15 - 41 U/L   ALT 13 0 - 44 U/L   Alkaline Phosphatase 59 38 - 126 U/L   Total Bilirubin 0.3 0.3 - 1.2 mg/dL   GFR calc non Af Amer >60 >60 mL/min   Anion gap 6 5 - 15  hCG, quantitative, pregnancy     Status: Abnormal   Collection Time: 06/23/20  4:04 PM  Result Value Ref Range   hCG, Beta Chain, Quant, S 57 (H) <5 mIU/mL  Wet prep, genital     Status: Abnormal   Collection Time: 06/23/20  4:30 PM  Result Value Ref Range   Yeast Wet Prep HPF POC NONE SEEN NONE SEEN   Trich, Wet Prep NONE SEEN NONE SEEN   Clue Cells Wet Prep HPF POC NONE SEEN NONE SEEN   WBC, Wet Prep HPF POC MANY (A) NONE SEEN   Sperm NONE SEEN   ABO/Rh     Status: None   Collection Time: 06/23/20  5:39 PM  Result Value Ref Range   ABO/RH(D)      O NEG Performed at [facility], [address], [place], [place] [address]   Rh IG workup (includes ABO/Rh)     Status: None   Collection Time: 06/23/20  6:12 PM  Result Value Ref Range   Gestational Age(Wks) 5.6    ABO/RH(D)      O NEG Performed at [facility], [address] [place], [place] [address]    US OB LESS THAN 14 WEEKS WITH OB TRANSVAGINAL  Result Date: 06/23/2020 CLINICAL DATA:  Bleeding EXAM: OBSTETRIC <14 WK [place] AND TRANSVAGINAL OB US TECHNIQUE: Both transabdominal  and transvaginal ultrasound examinations were performed for complete evaluation of the gestation as well as the maternal uterus, adnexal regions, and pelvic cul-de-sac. Transvaginal technique was performed to assess early pregnancy. COMPARISON:  None. FINDINGS: Intrauterine gestational sac: None Yolk sac:  Not Visualized. Embryo:  Not Visualized. Maternal uterus/adnexae: Ovaries appear within normal limits. Left ovary measures 3.2 x 2.4 x 2.4 cm. Right ovary measures 2 x 3 x 1.9 cm. No significant free fluid. IMPRESSION: No IUP identified. Findings consistent with pregnancy of unknown location, differential of which includes IUP too early to visualize, recent failed pregnancy, and occult ectopic pregnancy. Recommend trending of HCG with repeat ultrasound as indicated. Electronically Signed   By: Jasmine Pang M.D.   On: 06/23/2020 17:35    MAU Course  Procedures  MDM -r/o ectopic -UA: amber/hazy/mod hgb/100PRO/trace leuks/few bacteria, sending urine for culture -CBC: WNL -CMP: WNL -[place]: IUP -hCG: 57 -ABO: O NEGATIVE -WetPrep: WNL -GC/CT collected -pt discharged to home in stable condition  Orders Placed This Encounter  Procedures  . Wet prep, genital    Standing Status:   Standing    Number of  Occurrences:   1  . Culture, OB Urine    Standing Status:   Standing    Number of Occurrences:   1  . US OB LESS THAN 14 WEEKS WITH OB TRANSVAGINAL    Standing Status:   Standing    Number of Occurrences:   1    Order Specific Question:   Symptom/Reason for Exam    Answer:   Vaginal bleeding in pregnancy [[id number]]  . Urinalysis, Routine w reflex microscopic Urine, Clean Catch    Standing Status:   Standing    Number of Occurrences:   1  . CBC    Standing Status:   Standing    Number of Occurrences:   1  . Comprehensive metabolic panel    Standing Status:   Standing    Number of Occurrences:   1  . hCG, quantitative, pregnancy    Standing Status:   Standing    Number of Occurrences:   1   . Pregnancy, urine POC    Standing Status:   Standing    Number of Occurrences:   1  . ABO/Rh    Standing Status:   Standing    Number of Occurrences:   1  . Rh IG workup (includes ABO/Rh)    Standing Status:   Standing    Number of Occurrences:   1    Order Specific Question:   Weeks of Gestation    Answer:   5.6  . Discharge patient    Order Specific Question:   Discharge disposition    Answer:   01-Home or Self Care [1]    Order Specific Question:   Discharge patient date    Answer:   06/23/2020   Meds ordered this encounter  Medications  . rho (d) immune globulin (RHIG/RHOPHYLAC) injection 300 mcg    Assessment and Plan   1. Pregnancy of unknown anatomic location   2. Vaginal bleeding in pregnancy   3. Rh negative state in antepartum period     Allergies as of 06/23/2020      Reactions   Peanut-containing Drug Products Swelling   PEANUT BUTTER: throat swells   Septra [sulfamethoxazole-trimethoprim] Rash      Medication List    TAKE these medications   albuterol (2.5 MG/3ML) 0.083% nebulizer solution Commonly known as: PROVENTIL INHALE 1 VIAL VIA NEBULIZER EVERY 6 HOURS   albuterol 108 (90 Base) MCG/ACT inhaler Commonly known as: VENTOLIN HFA INHALE 2 PUFFS BY MOUTH EVERY 4 TO 6 HOURS AS NEEDED FOR COUGH AND FOR WHEEZING   ascorbic acid 100 MG tablet Commonly known as: VITAMIN C Take by mouth.   budesonide-formoterol 160-4.5 MCG/ACT inhaler Commonly known as: Symbicort Inhale 2 puffs into the lungs 2 (two) times daily.   Dupixent 300 MG/2ML prefilled syringe Generic drug: dupilumab INJECT 300MG  (1 SYRINGE) SUBCUTANEOUSLY EVERY OTHER WEEK.   EPINEPHrine 0.3 mg/0.3 mL Soaj injection Commonly known as: Auvi-Q Use as directed for severe allergic reaction   Eucrisa 2 % Oint Generic drug: Crisaborole Apply 1 application topically 2 (two) times daily.   levocetirizine 5 MG tablet Commonly known as: XYZAL Take 1 tablet (5 mg total) by mouth every  evening.   montelukast 10 MG tablet Commonly known as: SINGULAIR Take 1 tablet (10 mg total) by mouth every evening.   omeprazole 20 MG capsule Commonly known as: PRILOSEC Take 1 capsule (20 mg total) by mouth daily.       -will call with culture results, if positive -safe  meds in pregnancy list given -list of OB providers given -discussed ectopic vs. SAB vs. miscarriage -strict ectopic precautions given -return MAU precautions -f/u on Saturday 10/9 at MAU for repeat hCG -pt discharged to home in stable condition  Joni Reining E Davanee Klinkner 06/23/2020, 6:25 PM

## 2020-06-24 LAB — CULTURE, OB URINE: Culture: NO GROWTH

## 2020-06-24 LAB — RH IG WORKUP (INCLUDES ABO/RH)
ABO/RH(D): O NEG
Antibody Screen: NEGATIVE
Gestational Age(Wks): 5.6
Unit division: 0

## 2020-06-24 LAB — GC/CHLAMYDIA PROBE AMP (~~LOC~~) NOT AT ARMC
Chlamydia: NEGATIVE
Comment: NEGATIVE
Comment: NORMAL
Neisseria Gonorrhea: NEGATIVE

## 2020-06-26 DIAGNOSIS — Z3491 Encounter for supervision of normal pregnancy, unspecified, first trimester: Secondary | ICD-10-CM | POA: Insufficient documentation

## 2020-06-27 ENCOUNTER — Encounter: Payer: Self-pay | Admitting: Student

## 2020-06-27 ENCOUNTER — Telehealth: Payer: Self-pay | Admitting: Student

## 2020-06-27 DIAGNOSIS — O26899 Other specified pregnancy related conditions, unspecified trimester: Secondary | ICD-10-CM | POA: Insufficient documentation

## 2020-06-27 DIAGNOSIS — Z6791 Unspecified blood type, Rh negative: Secondary | ICD-10-CM | POA: Insufficient documentation

## 2020-06-27 NOTE — Telephone Encounter (Signed)
Left vm for patient to call MAU back or come in for repeat blood work. Will try to contact patient again this evening.   Casey Weaver

## 2020-06-27 NOTE — Telephone Encounter (Signed)
Attempted to reach patient again, left VM to come to [facility] for follow up.   Casey Weaver

## 2020-07-02 ENCOUNTER — Encounter: Payer: Managed Care, Other (non HMO) | Admitting: Adult Health

## 2021-04-07 ENCOUNTER — Ambulatory Visit: Payer: PRIVATE HEALTH INSURANCE | Admitting: Family Medicine

## 2021-04-07 ENCOUNTER — Other Ambulatory Visit: Payer: Self-pay

## 2021-04-07 ENCOUNTER — Encounter: Payer: Self-pay | Admitting: Family Medicine

## 2021-04-07 VITALS — BP 132/91 | HR 110 | Temp 98.2°F | Ht 60.0 in | Wt 182.0 lb

## 2021-04-07 DIAGNOSIS — K219 Gastro-esophageal reflux disease without esophagitis: Secondary | ICD-10-CM | POA: Diagnosis not present

## 2021-04-07 DIAGNOSIS — Z8759 Personal history of other complications of pregnancy, childbirth and the puerperium: Secondary | ICD-10-CM

## 2021-04-07 DIAGNOSIS — J4520 Mild intermittent asthma, uncomplicated: Secondary | ICD-10-CM

## 2021-04-07 DIAGNOSIS — L2084 Intrinsic (allergic) eczema: Secondary | ICD-10-CM

## 2021-04-07 DIAGNOSIS — Z91010 Allergy to peanuts: Secondary | ICD-10-CM | POA: Diagnosis not present

## 2021-04-07 DIAGNOSIS — Z6791 Unspecified blood type, Rh negative: Secondary | ICD-10-CM

## 2021-04-07 MED ORDER — LEVOCETIRIZINE DIHYDROCHLORIDE 5 MG PO TABS: 5.0000 mg | ORAL_TABLET | Freq: Every evening | ORAL | 3 refills | Status: DC

## 2021-04-07 MED ORDER — EPINEPHRINE 0.3 MG/0.3ML IJ SOAJ: INTRAMUSCULAR | 1 refills | Status: DC

## 2021-04-07 MED ORDER — OMEPRAZOLE 20 MG PO CPDR: 20.0000 mg | DELAYED_RELEASE_CAPSULE | Freq: Every day | ORAL | 3 refills | Status: AC

## 2021-04-07 MED ORDER — MONTELUKAST SODIUM 10 MG PO TABS: 10.0000 mg | ORAL_TABLET | Freq: Every evening | ORAL | 3 refills | Status: DC

## 2021-04-07 MED ORDER — ALBUTEROL SULFATE HFA 108 (90 BASE) MCG/ACT IN AERS: INHALATION_SPRAY | RESPIRATORY_TRACT | 1 refills | Status: DC

## 2021-04-07 NOTE — Progress Notes (Signed)
Subjective: CC: "med refills" PCP: Raliegh Ip, DO ZOX:WRUEA C Casey is a 21 y.o. female presenting to clinic today for:  1.  Allergy induced asthma Patient with history of allergy, asthma and atopic dermatitis.  She is followed by allergy and asthma but she has not seen them in a while.  She is treated with Xyzal, Singulair, Ventolin.  She is not really required Symbicort so she is discontinued this.  Not sure if EpiPen is out of date but she does have 1.  Needs refills  2.  GERD Patient reports good control with omeprazole.  Needs refills  3.  Rh- with history of spontaneous miscarriage Patient with history of miscarriage in 2021.  She was found to be Rh- and apparently the fetus was Rh+.  She has not yet scheduled with OB/GYN but would be amenable to seeing one at some point.  She is not planning for pregnancy anytime soon but wants to make sure that she is established with somebody when she is ready that she can get her RhoGAM at the appropriate intervals, etc.   ROS: Per HPI  Allergies  Allergen Reactions   Peanut-Containing Drug Products Swelling    PEANUT BUTTER: throat swells   Septra [Sulfamethoxazole-Trimethoprim] Rash   Past Medical History:  Diagnosis Date   Allergic rhinoconjunctivitis    Asthma    Attention deficit disorder (ADD)    Eczema    Food allergy     Current Outpatient Medications:    albuterol (PROVENTIL) (2.5 MG/3ML) 0.083% nebulizer solution, INHALE 1 VIAL VIA NEBULIZER EVERY 6 HOURS, Disp: 375 mL, Rfl: 1   albuterol (VENTOLIN HFA) 108 (90 Base) MCG/ACT inhaler, INHALE 2 PUFFS BY MOUTH EVERY 4 TO 6 HOURS AS NEEDED FOR COUGH AND FOR WHEEZING, Disp: 18 g, Rfl: 1   budesonide-formoterol (SYMBICORT) 160-4.5 MCG/ACT inhaler, Inhale 2 puffs into the lungs 2 (two) times daily., Disp: 10.2 g, Rfl: 5   Crisaborole (EUCRISA) 2 % OINT, Apply 1 application topically 2 (two) times daily., Disp: 60 g, Rfl: 1   DUPIXENT 300 MG/2ML prefilled syringe, INJECT  300MG  (1 SYRINGE) SUBCUTANEOUSLY EVERY OTHER WEEK., Disp: 4 mL, Rfl: 11   EPINEPHrine (AUVI-Q) 0.3 mg/0.3 mL IJ SOAJ injection, Use as directed for severe allergic reaction, Disp: 2 each, Rfl: 1   levocetirizine (XYZAL) 5 MG tablet, Take 1 tablet (5 mg total) by mouth every evening., Disp: 30 tablet, Rfl: 5   montelukast (SINGULAIR) 10 MG tablet, Take 1 tablet (10 mg total) by mouth every evening., Disp: 30 tablet, Rfl: 5   omeprazole (PRILOSEC) 20 MG capsule, Take 1 capsule (20 mg total) by mouth daily., Disp: 90 capsule, Rfl: 3 Social History   Socioeconomic History   Marital status: Single    Spouse name: Not on file   Number of children: Not on file   Years of education: Not on file   Highest education level: Not on file  Occupational History   Not on file  Tobacco Use   Smoking status: Never   Smokeless tobacco: Never  Vaping Use   Vaping Use: Never used  Substance and Sexual Activity   Alcohol use: No   Drug use: No   Sexual activity: Yes  Other Topics Concern   Not on file  Social History Narrative   Not on file   Social Determinants of Health   Financial Resource Strain: Not on file  Food Insecurity: Not on file  Transportation Needs: Not on file  Physical Activity: Not  on file  Stress: Not on file  Social Connections: Not on file  Intimate Partner Violence: Not on file   Family History  Problem Relation Age of Onset   Eczema Mother    Allergic rhinitis Mother    Angioedema Neg Hx    Asthma Neg Hx    Immunodeficiency Neg Hx    Urticaria Neg Hx     Objective: Office vital signs reviewed. BP (!) 132/91   Pulse (!) 110   Temp 98.2 F (36.8 C)   Ht 5' (1.524 m)   Wt 182 lb (82.6 kg)   LMP 05/13/2020   SpO2 99%   Breastfeeding No   BMI 35.54 kg/m   Physical Examination:  General: Awake, alert, well nourished, No acute distress HEENT: Normal; sclera white.  Moist mucous membranes Cardio: regular rate and rhythm, S1S2 heard, no murmurs  appreciated Pulm: clear to auscultation bilaterally, no wheezes, rhonchi or rales; normal work of breathing on room air MSK: Ambulating independently Skin: Postinflammatory hyperpigmentation noted along the left anterior shin Assessment/ Plan: 20 y.o. female   Gastroesophageal reflux disease without esophagitis - Plan: omeprazole (PRILOSEC) 20 MG capsule  Mild intermittent chronic asthma without complication - Plan: albuterol (VENTOLIN HFA) 108 (90 Base) MCG/ACT inhaler, montelukast (SINGULAIR) 10 MG tablet  Intrinsic eczema - Plan: montelukast (SINGULAIR) 10 MG tablet, levocetirizine (XYZAL) 5 MG tablet  Peanut allergy - Plan: EPINEPHrine (AUVI-Q) 0.3 mg/0.3 mL IJ SOAJ injection  History of one miscarriage - Plan: Ambulatory referral to Obstetrics / Gynecology  Blood type, Rh negative - Plan: Ambulatory referral to Obstetrics / Gynecology  GERD is well controlled.  Continue PPI as needed  Not requiring daily inhaler.  Rescue sent.  Singulair and Xyzal renewed  Continue to follow-up with asthma and allergy for ongoing treatment for eczema  EpiPen renewed and placed on hold  Referral to OB/GYN placed to establish care.  No orders of the defined types were placed in this encounter.  No orders of the defined types were placed in this encounter.    Raliegh Ip, DO [facility] [phone]

## 2021-04-28 ENCOUNTER — Encounter: Payer: Self-pay | Admitting: Adult Health

## 2021-04-28 ENCOUNTER — Other Ambulatory Visit (HOSPITAL_COMMUNITY)
Admission: RE | Admit: 2021-04-28 | Discharge: 2021-04-28 | Disposition: A | Discharge status: Home / Self Care | Payer: Managed Care, Other (non HMO) | Source: Ambulatory Visit | Attending: Adult Health | Admitting: Adult Health

## 2021-04-28 ENCOUNTER — Ambulatory Visit (INDEPENDENT_AMBULATORY_CARE_PROVIDER_SITE_OTHER): Payer: Managed Care, Other (non HMO) | Admitting: Adult Health

## 2021-04-28 ENCOUNTER — Other Ambulatory Visit: Payer: Self-pay

## 2021-04-28 VITALS — BP 130/89 | HR 107 | Ht 60.0 in | Wt 186.5 lb

## 2021-04-28 DIAGNOSIS — Z01419 Encounter for gynecological examination (general) (routine) without abnormal findings: Secondary | ICD-10-CM | POA: Insufficient documentation

## 2021-04-28 NOTE — Progress Notes (Signed)
Patient ID: Casey Weaver, female   DOB: 2001/08/31, 20 y.o.   MRN: [id number] History of Present Illness: Casey Weaver is a 20 year old white female,single, G1P0010, n for well woman gyn exam and pap. She is a new [pt. She had miscarriage in October and has lost 30 lbs since then. PCP is Dr Nadine Counts.   Current Medications, Allergies, Past Medical History, Past Surgical History, Family History and Social History were reviewed in [employer] record.     Review of Systems: Patient denies any headaches, hearing loss, fatigue, blurred vision, shortness of breath, chest pain, abdominal pain, problems with bowel movements, urination, or intercourse. No joint pain or mood swings.     Physical Exam:BP 130/89 (BP Location: Left Arm, Patient Position: Sitting, Cuff Size: Normal)   Pulse (!) 107   Ht 5' (1.524 m)   Wt 186 lb 8 oz (84.6 kg)   LMP 04/01/2021   BMI 36.42 kg/m   General:  Well developed, well nourished, no acute distress Skin:  Warm and dry Neck:  Midline trachea, normal thyroid, good ROM, no lymphadenopathy Lungs; Clear to auscultation bilaterally Breast:  No dominant palpable mass, retraction, or nipple discharge Cardiovascular: Regular rate and rhythm Abdomen:  Soft, non tender, no hepatosplenomegaly Pelvic:  External genitalia is normal in appearance, no lesions.  The vagina is normal in appearance. Urethra has no lesions or masses. The cervix is smooth, pap with GC/CHL performed.  Uterus is felt to be normal size, shape, and contour.  No adnexal masses or tenderness noted.Bladder is non tender, no masses felt. Rectal: Deferred Extremities/musculoskeletal:  No swelling or varicosities noted, no clubbing or cyanosis Psych:  No mood changes, alert and cooperative,seems happy AA is 4 Fall is risk is low Depression screen [facility] 2/9 04/28/2021 04/07/2021 11/19/2018  Decreased Interest 0 0 0  Down, Depressed, Hopeless 0 0 0  PHQ - 2 Score 0 0 0  Altered sleeping 0 - -   Tired, decreased energy 0 - -  Change in appetite 0 - -  Feeling bad or failure about yourself  0 - -  Trouble concentrating 0 - -  Moving slowly or fidgety/restless 0 - -  Suicidal thoughts 0 - -  PHQ-9 Score 0 - -     GAD 7 : Generalized Anxiety Score 04/28/2021 04/07/2021  Nervous, Anxious, on Edge 0 0  Control/stop worrying 0 0  Worry too much - different things 0 0  Trouble relaxing 0 0  Restless 0 0  Easily annoyed or irritable 0 0  Afraid - awful might happen 0 0  Total GAD 7 Score 0 0  Anxiety Difficulty - Not difficult at all      Upstream - 04/28/21 1124       Pregnancy Intention Screening   Does the patient want to become pregnant in the next year? No    Does the patient's partner want to become pregnant in the next year? No    Would the patient like to discuss contraceptive options today? No      Contraception Wrap Up   Current Method No Method - Other Reason    End Method No Method - Other Reason    Contraception Counseling Provided No            Examination chaperoned by Malachy Mood LPN   Impression and Plan: 1. Encounter for gynecological examination with Papanicolaou smear of cervix Pap sent Physical in 1 year Pap in 3 if normal Take PNV  with folic acid

## 2021-04-29 LAB — CYTOLOGY - PAP
Chlamydia: NEGATIVE
Comment: NEGATIVE
Comment: NORMAL
Diagnosis: NEGATIVE
Neisseria Gonorrhea: NEGATIVE

## 2021-05-31 ENCOUNTER — Other Ambulatory Visit: Payer: Self-pay

## 2021-05-31 ENCOUNTER — Ambulatory Visit
Admission: EM | Admit: 2021-05-31 | Discharge: 2021-05-31 | Disposition: A | Discharge status: Home / Self Care | Payer: Managed Care, Other (non HMO) | Attending: Internal Medicine | Admitting: Internal Medicine

## 2021-05-31 ENCOUNTER — Encounter: Payer: Self-pay | Admitting: Emergency Medicine

## 2021-05-31 DIAGNOSIS — J029 Acute pharyngitis, unspecified: Secondary | ICD-10-CM

## 2021-05-31 NOTE — Discharge Instructions (Signed)
Increase oral fluid intake Please quarantine until COVID-19 test results available Return to urgent care if symptoms worsen Use Tylenol/Motrin as needed for fever and/or body aches.

## 2021-05-31 NOTE — ED Triage Notes (Signed)
Nasal congestion since Sunday.  Fever on Monday, sore throat today.  States she has been cleaning out a dusty house.

## 2021-06-01 LAB — NOVEL CORONAVIRUS, NAA: SARS-CoV-2, NAA: DETECTED — AB

## 2021-06-01 LAB — SARS-COV-2, NAA 2 DAY TAT

## 2021-06-01 NOTE — ED Provider Notes (Signed)
[facility]    CSN: [id number] Arrival date & time: 05/31/21  1725      History   Chief Complaint No chief complaint on file.   HPI Casey Weaver is a 20 y.o. female comes to the urgent care with a 2 day history of nasal congestion, sore throat and fever.  Patient's symptoms started sequentially from the runny nose to fever and sore throat.  She denies any shortness of breath.  Symptoms started after doing some housecleaning.  No nausea, vomiting or diarrhea.  Patient denies any sick contacts. HPI  Past Medical History:  Diagnosis Date   Allergic rhinoconjunctivitis    Asthma    Attention deficit disorder (ADD)    Eczema    Food allergy     Patient Active Problem List   Diagnosis Date Noted   Rh negative state in antepartum period 06/27/2020   First trimester pregnancy 06/26/2020   Depression, major, single episode, moderate (HCC) 04/22/2018   Gastroesophageal reflux disease without esophagitis 06/16/2017   Eczema 11/06/2016   Chronic asthma 06/30/2016   Intrinsic eczema 06/30/2016   Attention deficit hyperactivity disorder (ADHD), predominantly inattentive type 06/30/2016    Past Surgical History:  Procedure Laterality Date   NO PAST SURGERIES      OB History     Gravida  1   Para      Term      Preterm      AB  1   Living         SAB  1   IAB      Ectopic      Multiple      Live Births               Home Medications    Prior to Admission medications   Medication Sig Start Date End Date Taking? Authorizing Provider  albuterol (PROVENTIL) (2.5 MG/3ML) 0.083% nebulizer solution INHALE 1 VIAL VIA NEBULIZER EVERY 6 HOURS 10/04/17   Remus Loffler, PA-C  albuterol (VENTOLIN HFA) 108 (90 Base) MCG/ACT inhaler INHALE 2 PUFFS BY MOUTH EVERY 4 TO 6 HOURS AS NEEDED FOR COUGH AND FOR WHEEZING 04/07/21   Delynn Flavin M, DO  APPLE CIDER VINEGAR PO Take by mouth. Gummies-takes 1 daily    [provider]  DUPIXENT 300 MG/2ML  prefilled syringe INJECT 300MG  (1 SYRINGE) SUBCUTANEOUSLY EVERY OTHER WEEK. 05/19/19   Kozlow, 05/21/19, MD  EPINEPHrine (AUVI-Q) 0.3 mg/0.3 mL IJ SOAJ injection Use as directed for severe allergic reaction PUT ON FILE 04/07/21   04/09/21 M, DO  levocetirizine (XYZAL) 5 MG tablet Take 1 tablet (5 mg total) by mouth every evening. 04/07/21   04/09/21 M, DO  montelukast (SINGULAIR) 10 MG tablet Take 1 tablet (10 mg total) by mouth every evening. 04/07/21   04/09/21 M, DO  omeprazole (PRILOSEC) 20 MG capsule Take 1 capsule (20 mg total) by mouth daily. 04/07/21   04/09/21, DO    Family History Family History  Problem Relation Age of Onset   Hypertension Father    Eczema Mother    Allergic rhinitis Mother    Angioedema Neg Hx    Asthma Neg Hx    Immunodeficiency Neg Hx    Urticaria Neg Hx     Social History Social History   Tobacco Use   Smoking status: Never   Smokeless tobacco: Never  Vaping Use   Vaping Use: Never used  Substance Use Topics  Alcohol use: Yes   Drug use: No     Allergies   Peanut-containing drug products and Septra [sulfamethoxazole-trimethoprim]   Review of Systems Review of Systems As per HPI  Physical Exam Triage Vital Signs ED Triage Vitals  Enc Vitals Group     BP 05/31/21 1847 126/84     Pulse Rate 05/31/21 1847 (!) 120     Resp 05/31/21 1847 18     Temp 05/31/21 1847 98.3 F (36.8 C)     Temp Source 05/31/21 1847 Oral     SpO2 05/31/21 1847 97 %     Weight --      Height --      Head Circumference --      Peak Flow --      Pain Score 05/31/21 1848 0     Pain Loc --      Pain Edu? --      Excl. in GC? --    No data found.  Updated Vital Signs BP 126/84 (BP Location: Right Arm)   Pulse (!) 120   Temp 98.3 F (36.8 C) (Oral)   Resp 18   LMP 05/28/2021 (Exact Date)   SpO2 97%   Visual Acuity Right Eye Distance:   Left Eye Distance:   Bilateral Distance:    Right Eye Near:   Left Eye Near:     Bilateral Near:     Physical Exam HENT:     Right Ear: Tympanic membrane normal.     Left Ear: Tympanic membrane normal.     Mouth/Throat:     Pharynx: No posterior oropharyngeal erythema.  Eyes:     Pupils: Pupils are equal, round, and reactive to light.  Cardiovascular:     Rate and Rhythm: Normal rate and regular rhythm.     Pulses: Normal pulses.     Heart sounds: Normal heart sounds.  Pulmonary:     Effort: Pulmonary effort is normal.     Breath sounds: Normal breath sounds.     UC Treatments / Results  Labs (all labs ordered are listed, but only abnormal results are displayed)   EKG   Radiology No results found.  Procedures Procedures (including critical care time)  Medications Ordered in UC Medications - No data to display  Initial Impression / Assessment and Plan / UC Course  I have reviewed the triage vital signs and the nursing notes.  Pertinent labs & imaging results that were available during my care of the patient were reviewed by me and considered in my medical decision making (see chart for details).     1.  Acute viral pharyngitis: Increase oral fluid intake COVID-19 PCR test has been sent Tylenol/Motrin as needed for fever and/body aches.  We will call you with recommendations if labs are abnormal. Return to urgent care if symptoms worsen. Final Clinical Impressions(s) / UC Diagnoses   Final diagnoses:  Acute viral pharyngitis     Discharge Instructions      Increase oral fluid intake Please quarantine until COVID-19 test results available Return to urgent care if symptoms worsen Use Tylenol/Motrin as needed for fever and/or body aches.   ED Prescriptions   None    PDMP not reviewed this encounter.   Merrilee Jansky, MD 06/01/21 [phone]

## 2021-08-06 IMAGING — US US OB < 14 WEEKS - US OB TV
1 series · 15 of 28 positions shown · non-contrast
Comparison: None.

CLINICAL DATA: Bleeding

EXAM:
OBSTETRIC <14 WK US AND TRANSVAGINAL OB US
TECHNIQUE: Both transabdominal and transvaginal ultrasound examinations were
performed for complete evaluation of the gestation as well as the
maternal uterus, adnexal regions, and pelvic cul-de-sac.
Transvaginal technique was performed to assess early pregnancy.

[Series 1: us ob < 14 weeks - us ob tv · 61 acquisitions, 15 frames shown]
[im 1/61]
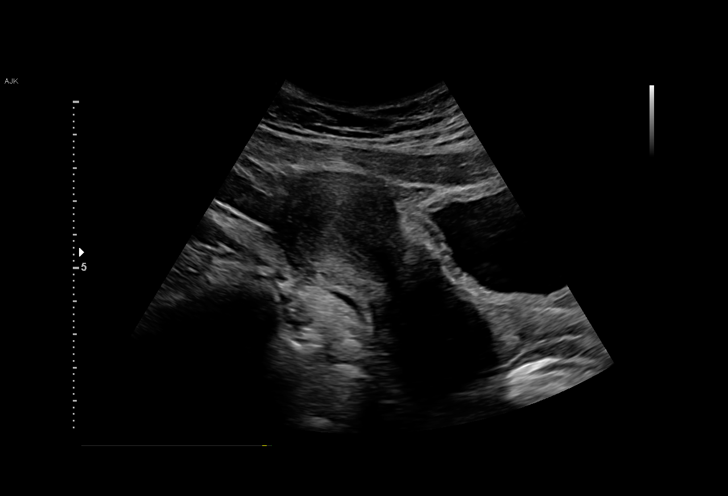
[im 5/61]
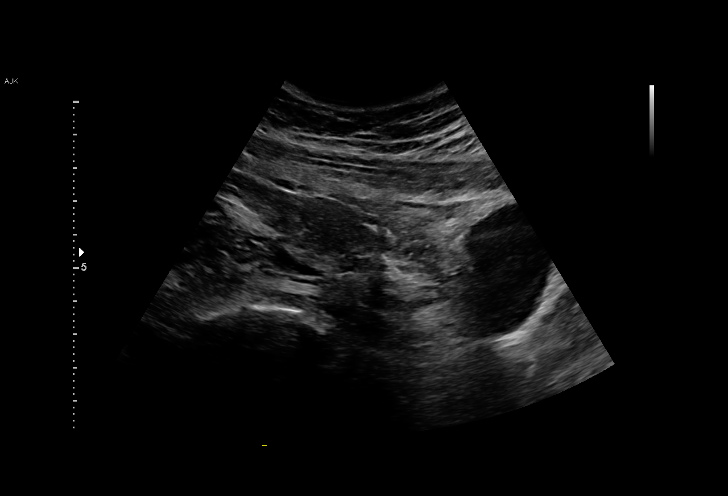
[im 9/61]
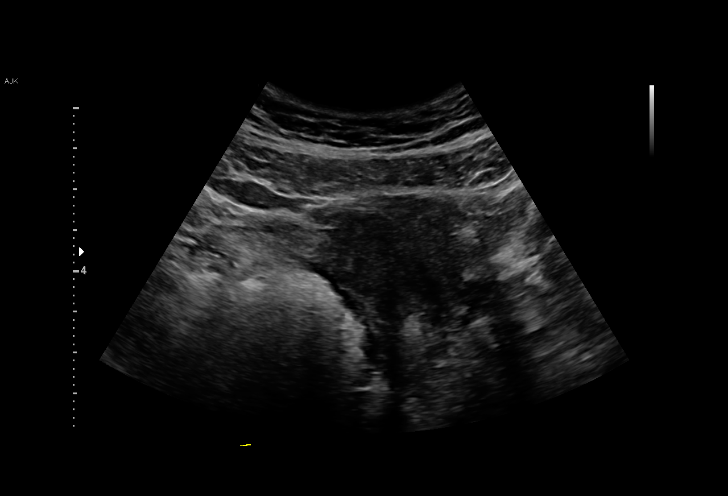
[im 14/61]
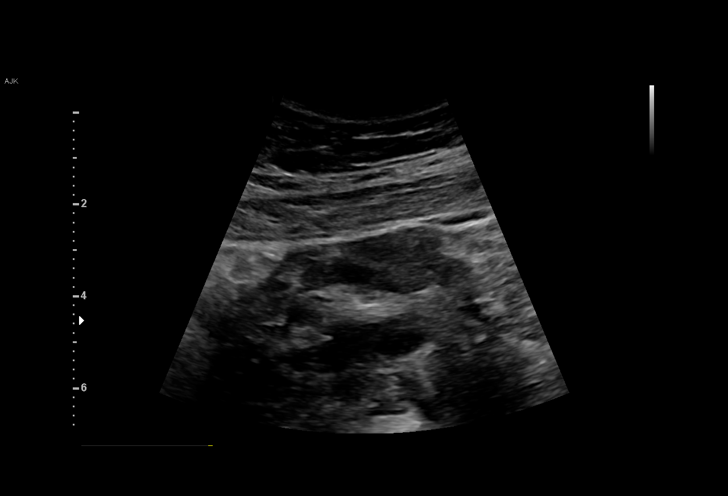
[im 18/61]
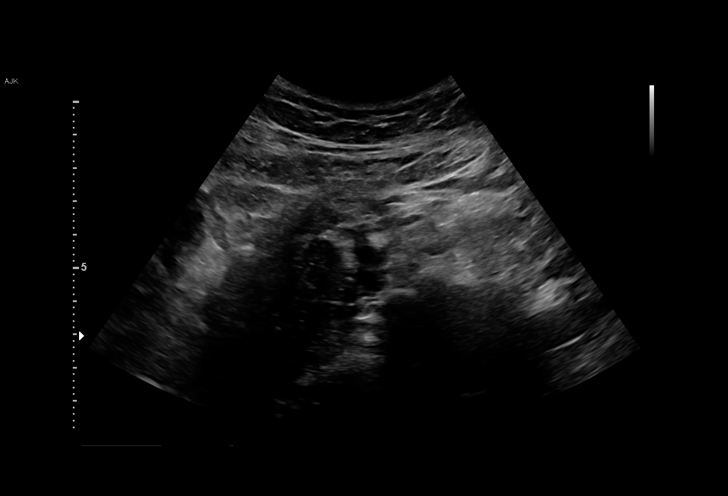
[im 23/61]
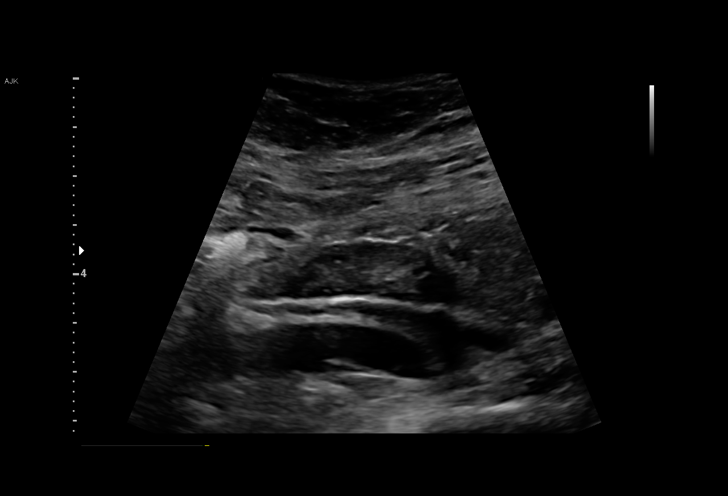
[im 27/61]
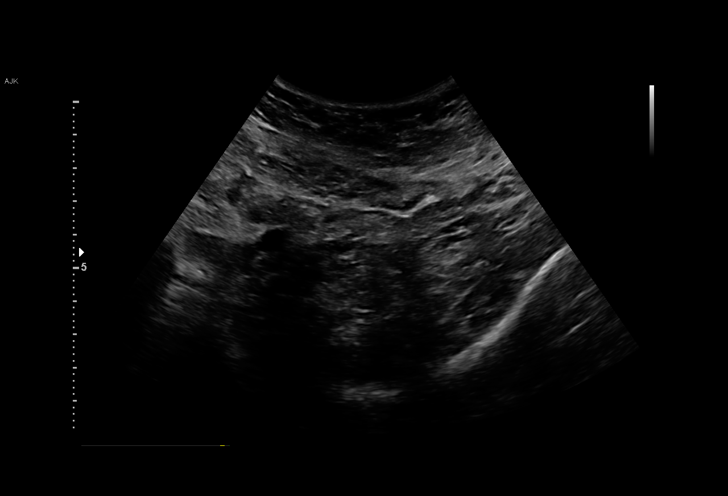
[im 32/61]
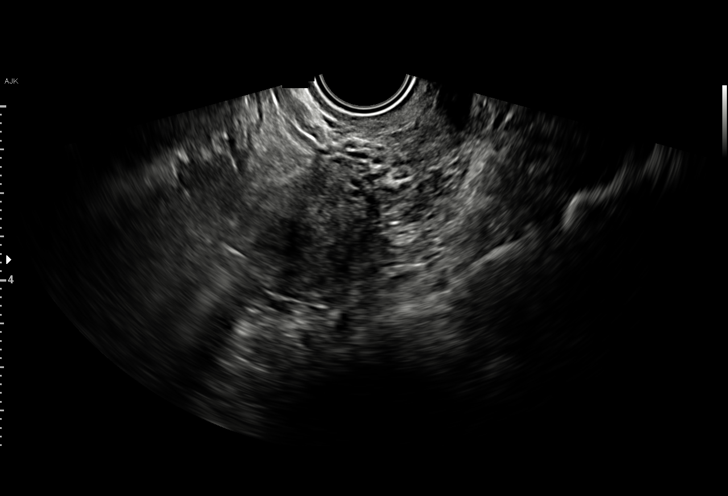
[im 34/61]
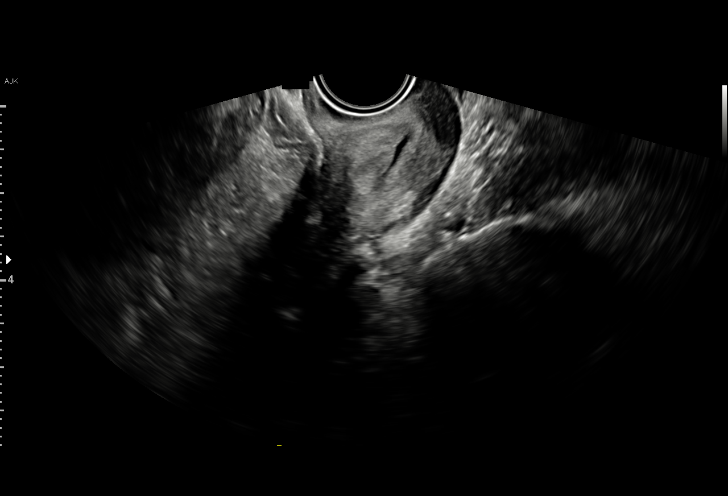
[im 38/61]
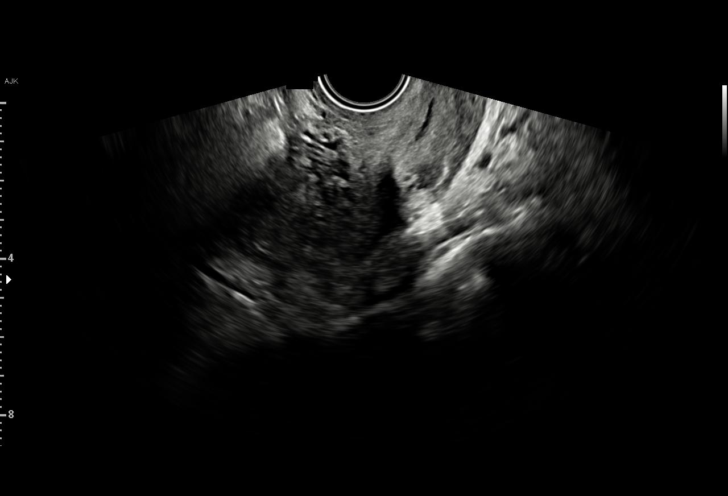
[im 43/61]
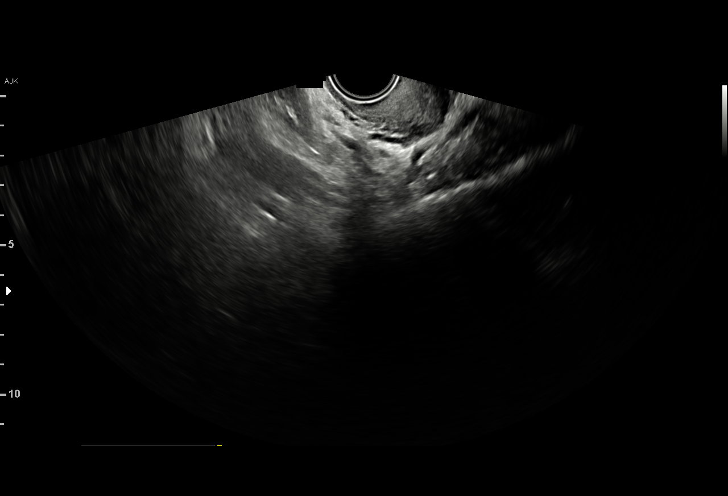
[im 47/61]
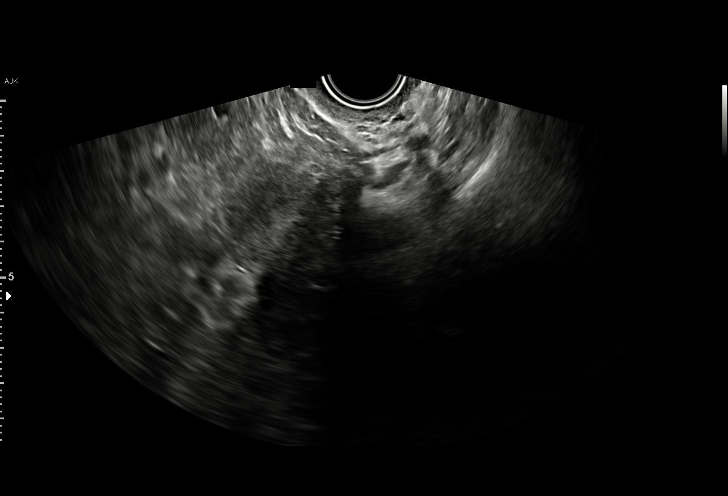
[im 52/61]
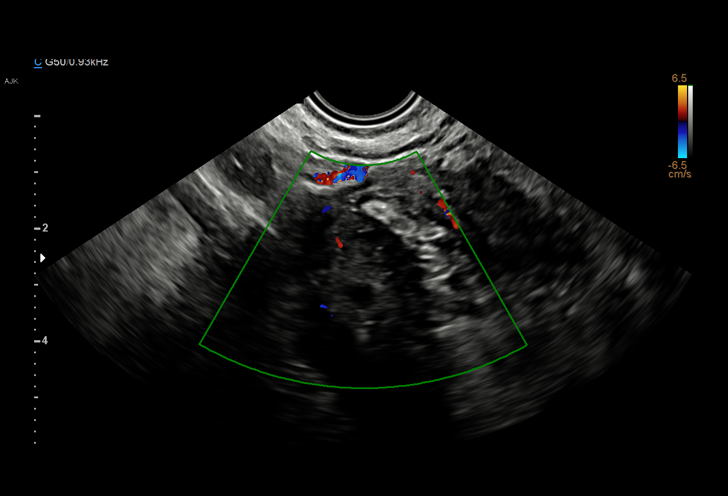
[im 56/61]
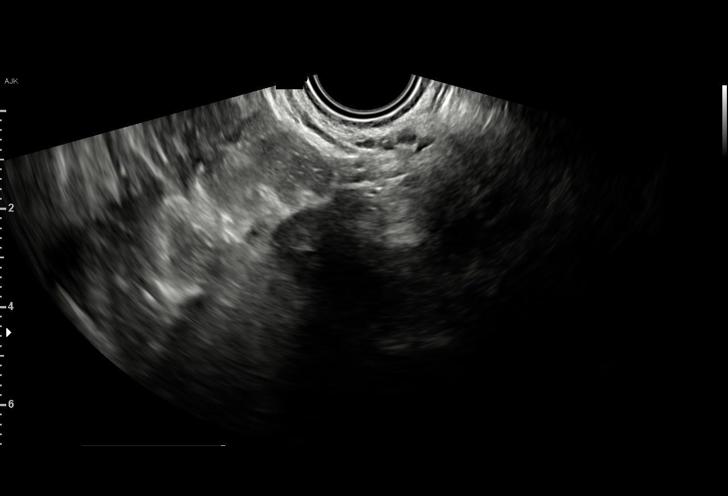
[im 61/61]
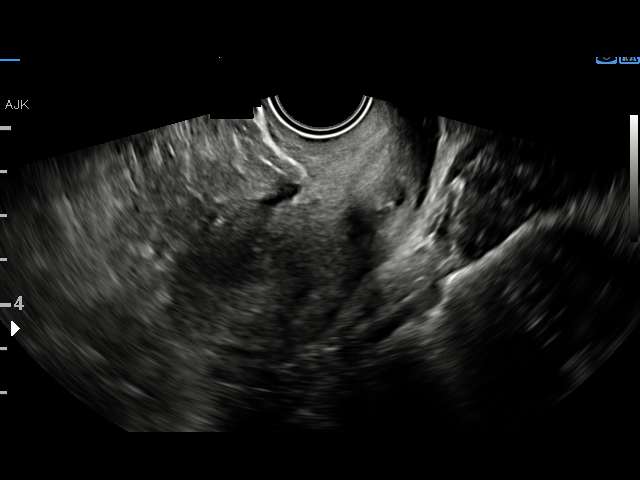

[15 of 28 positions shown; findings below may reference images not displayed]

FINDINGS: Intrauterine gestational sac: None

Yolk sac:  Not Visualized.

Embryo:  Not Visualized.

Maternal uterus/adnexae: Ovaries appear within normal limits. Left
ovary measures 3.2 x 2.4 x 2.4 cm. Right ovary measures 2 x 3 x
cm. No significant free fluid.
IMPRESSION: No IUP identified. Findings consistent with pregnancy of unknown
location, differential of which includes IUP too early to visualize,
recent failed pregnancy, and occult ectopic pregnancy. Recommend
trending of HCG with repeat ultrasound as indicated.

## 2021-09-04 NOTE — Patient Instructions (Addendum)
Severe atopic dermatitis Continue Dupixent injections every 14 days at home Continue triamcinolone 0.1% cream - use 1 application twice a day to red itchy areas below the neck Continue daily moisturization  Allergic rhinoconjunctivitis Continue Xyzal 5 mg once a day as needed for runny nose and itching Continue montelukast 10 mg once a day  Moderate persistent asthma Continue Ventolin 2 puffs every 4-6 hours as needed for cough, wheeze, tightness in chest, or shortness of breath. May also use albuterol 2 puffs 5-15 minutes prior to exercise For asthma flares continue: Symbicort 160/4.5 - 2 puffs twice a day with spacer for 1-2 weeks Asthma control goals:  Full participation in all desired activities (may need albuterol before activity) Albuterol use two time or less a week on average (not counting use with activity) Cough interfering with sleep two time or less a month Oral steroids no more than once a year No hospitalizations  Anaphylactic reaction due to food Avoid peanuts and tree nuts. In case of an allergic reaction, give Benadryl 4 teaspoonfuls every 4 hours, and if life-threatening symptoms occur, inject with EpiPen 0.3 mg.  Please let us know if this treatment plan is not working well for you Schedule follow up appointment in 6 months or sooner if needed

## 2021-09-05 ENCOUNTER — Other Ambulatory Visit: Payer: Self-pay

## 2021-09-05 ENCOUNTER — Ambulatory Visit (INDEPENDENT_AMBULATORY_CARE_PROVIDER_SITE_OTHER): Payer: PRIVATE HEALTH INSURANCE | Admitting: Family

## 2021-09-05 ENCOUNTER — Encounter: Payer: Self-pay | Admitting: Family

## 2021-09-05 VITALS — BP 146/76 | HR 102 | Temp 98.0°F | Resp 20 | Ht 60.0 in | Wt 200.0 lb

## 2021-09-05 DIAGNOSIS — J309 Allergic rhinitis, unspecified: Secondary | ICD-10-CM

## 2021-09-05 DIAGNOSIS — T7800XD Anaphylactic reaction due to unspecified food, subsequent encounter: Secondary | ICD-10-CM

## 2021-09-05 DIAGNOSIS — D7219 Other eosinophilia: Secondary | ICD-10-CM

## 2021-09-05 DIAGNOSIS — J4540 Moderate persistent asthma, uncomplicated: Secondary | ICD-10-CM

## 2021-09-05 DIAGNOSIS — H1013 Acute atopic conjunctivitis, bilateral: Secondary | ICD-10-CM

## 2021-09-05 DIAGNOSIS — L2089 Other atopic dermatitis: Secondary | ICD-10-CM | POA: Diagnosis not present

## 2021-09-05 MED ORDER — ALBUTEROL SULFATE HFA 108 (90 BASE) MCG/ACT IN AERS
INHALATION_SPRAY | RESPIRATORY_TRACT | 1 refills | Status: DC
Start: 2021-09-05 — End: 2022-01-18

## 2021-09-05 MED ORDER — MONTELUKAST SODIUM 10 MG PO TABS
10.0000 mg | ORAL_TABLET | Freq: Every evening | ORAL | 1 refills | Status: DC
Start: 2021-09-05 — End: 2022-10-13

## 2021-09-05 MED ORDER — TRIAMCINOLONE ACETONIDE 0.1 % EX OINT: TOPICAL_OINTMENT | CUTANEOUS | 2 refills | Status: DC

## 2021-09-05 MED ORDER — LEVOCETIRIZINE DIHYDROCHLORIDE 5 MG PO TABS: 5.0000 mg | ORAL_TABLET | Freq: Every evening | ORAL | 1 refills | Status: AC

## 2021-09-05 NOTE — Progress Notes (Signed)
[address] [address] Dept: [phone]  FOLLOW UP NOTE  Patient ID: AMITA ATAYDE, female    DOB: [birth year]  Age: 20 y.o. MRN: [id number] Date of Office Visit: 09/05/2021  Assessment  Chief Complaint: Follow-up (No complaints or issues since last visit. ACT - 21. Did not realize her dupixent was running out on the first so would like to discuss that today.)  HPI Casey Weaver is a 20 year old female who presents today for follow-up of moderate persistent asthma, atopic dermatitis, allergic rhinoconjunctivitis, anaphylactic shock due to food, and eosinophilic leukocytosis.  She was last seen on April 23, 2020 by Nehemiah Settle, FNP.  She reports since her last office visit that she had a miscarriage on the same day she contacted [place] about questions about Dupixent and pregnancy.  Severe atopic dermatitis is reported as doing better with Dupixent injections every 14 days at home.  She also has triamcinolone 0.1% cream that she can use as needed.  She reports that her hands are chapped now but that is just due to the cold weather.  She reports that her Dupixent injections have really helped and previously when she was off Dupixent for 2 months that her eczema got worse.  She reports that she is due for her next Dupixent injection this Thursday.  She denies any problems with her Dupixent injections.  Allergic rhinoconjunctivitis is reported as controlled with Xyzal 5 mg once a day and montelukast 10 mg once a day.  She reports postnasal drip when she was sick and denies rhinorrhea and nasal congestion.  She has not had any sinus infections since we last saw her.  Moderate persistent asthma is reported as controlled with Ventolin as needed and Symbicort 160/4.5 mcg 2 puffs twice a day for asthma flares.  She reports an occasional dry cough that is lingering from where she was sick a month ago.  She denies wheezing, tightness in her chest, shortness of breath, and nocturnal  awakenings due to breathing problems.  Since her last office visit she has not required any trips to the emergency room or urgent care due to breathing problems she has not received any systemic steroids.  She uses her albuterol inhaler maybe 3 times a month.  She continues to avoid peanuts and tree nuts without any accidental ingestion or use of her EpiPen.  She reports that she just recently got her EpiPen refilled approximately 2 to 3 weeks ago.   Drug Allergies:  Allergies  Allergen Reactions   Peanut-Containing Drug Products Swelling    PEANUT BUTTER: throat swells   Septra [Sulfamethoxazole-Trimethoprim] Rash    Review of Systems: Review of Systems  Constitutional:  Negative for chills and fever.  HENT:         Reports postnasal drip when she was sick.  Denies rhinorrhea and nasal congestion  Eyes:        Denies itchy watery eyes  Respiratory:  Positive for cough. Negative for shortness of breath and wheezing.        Reports occasional dry cough from where she was sick a month ago.  Denies wheezing, tightness in her chest, shortness of breath, and nocturnal awakenings due to breathing problems  Cardiovascular:  Negative for chest pain and palpitations.  Gastrointestinal:        Denies heartburn or reflux symptoms  Genitourinary:  Negative for frequency.  Skin:  Negative for itching and rash.  Neurological:  Negative for headaches.  Endo/Heme/Allergies:  Positive for environmental  allergies.    Physical Exam: BP (!) 146/76 (BP Location: Right Arm, Patient Position: Sitting)    Pulse (!) 102    Temp 98 F (36.7 C) (Temporal)    Resp 20    Ht 5' (1.524 m)    Wt 200 lb (90.7 kg)    SpO2 100%    BMI 39.06 kg/m    Physical Exam Constitutional:      Appearance: Normal appearance.  HENT:     Head: Normocephalic and atraumatic.     Comments: Pharynx normal, eyes normal, ears normal, nose normal    Right Ear: Tympanic membrane, ear canal and external ear normal.     Left Ear:  Tympanic membrane, ear canal and external ear normal.     Mouth/Throat:     Mouth: Mucous membranes are moist.     Pharynx: Oropharynx is clear.  Eyes:     Conjunctiva/sclera: Conjunctivae normal.  Cardiovascular:     Rate and Rhythm: Regular rhythm.     Heart sounds: Normal heart sounds.  Pulmonary:     Effort: Pulmonary effort is normal.     Breath sounds: Normal breath sounds.     Comments: Lungs clear to auscultation Musculoskeletal:     Cervical back: Neck supple.  Skin:    General: Skin is warm.     Comments: No eczematous lesions noted  Neurological:     Mental Status: She is alert and oriented to person, place, and time.  Psychiatric:        Mood and Affect: Mood normal.        Behavior: Behavior normal.        Thought Content: Thought content normal.        Judgment: Judgment normal.    Diagnostics: FVC 3.23 L, FEV1 2.75 L.  Predicted FVC 3.30 L, predicted FEV1 2.89 L.  Spirometry indicates normal respiratory function.  Assessment and Plan: 1. Asthma, moderate persistent, well-controlled   2. Other atopic dermatitis   3. Allergic rhinoconjunctivitis   4. Anaphylactic shock due to food, subsequent encounter   5. Eosinophilic leukocytosis     Meds ordered this encounter  Medications   albuterol (VENTOLIN HFA) 108 (90 Base) MCG/ACT inhaler    Sig: INHALE 2 PUFFS BY MOUTH EVERY 4 TO 6 HOURS AS NEEDED FOR COUGH AND FOR WHEEZING    Dispense:  18 g    Refill:  1    Please dispense the white inhaler with the orange top.   levocetirizine (XYZAL) 5 MG tablet    Sig: Take 1 tablet (5 mg total) by mouth every evening.    Dispense:  90 tablet    Refill:  1   montelukast (SINGULAIR) 10 MG tablet    Sig: Take 1 tablet (10 mg total) by mouth every evening.    Dispense:  90 tablet    Refill:  1   triamcinolone ointment (KENALOG) 0.1 %    Sig: Use 1 application twice a day as needed to red itchy areas.  Do not use on face, neck, groin, or armpit region    Dispense:  30 g     Refill:  2    Patient Instructions  Severe atopic dermatitis Continue Dupixent injections every 14 days at home Continue triamcinolone 0.1% cream - use 1 application twice a day to red itchy areas below the neck Continue daily moisturization  Allergic rhinoconjunctivitis Continue Xyzal 5 mg once a day as needed for runny nose and itching Continue montelukast 10 mg once  a day  Moderate persistent asthma Continue Ventolin 2 puffs every 4-6 hours as needed for cough, wheeze, tightness in chest, or shortness of breath. May also use albuterol 2 puffs 5-15 minutes prior to exercise For asthma flares continue: Symbicort 160/4.5 - 2 puffs twice a day with spacer for 1-2 weeks Asthma control goals:  Full participation in all desired activities (may need albuterol before activity) Albuterol use two time or less a week on average (not counting use with activity) Cough interfering with sleep two time or less a month Oral steroids no more than once a year No hospitalizations  Anaphylactic reaction due to food Avoid peanuts and tree nuts. In case of an allergic reaction, give Benadryl 4 teaspoonfuls every 4 hours, and if life-threatening symptoms occur, inject with EpiPen 0.3 mg.  Please let us know if this treatment plan is not working well for you Schedule follow up appointment in 6 months or sooner if needed  Return in about 6 months (around 03/06/2022), or if symptoms worsen or fail to improve.    Thank you for the opportunity to care for this patient.  Please do not hesitate to contact me with questions.  Nehemiah Settle, FNP [facility]

## 2021-09-22 ENCOUNTER — Ambulatory Visit: Payer: PRIVATE HEALTH INSURANCE | Admitting: Family

## 2021-12-30 ENCOUNTER — Other Ambulatory Visit: Payer: Self-pay | Admitting: *Deleted

## 2021-12-30 ENCOUNTER — Telehealth: Payer: Self-pay | Admitting: *Deleted

## 2021-12-30 MED ORDER — TRIAMCINOLONE ACETONIDE 0.1 % EX CREA: TOPICAL_CREAM | CUTANEOUS | 0 refills | Status: DC

## 2021-12-30 NOTE — Telephone Encounter (Signed)
New prescription has been sent in.  

## 2021-12-30 NOTE — Telephone Encounter (Signed)
Patient had called regarding her Dupixent. She was getting same through the patient assistance due to no Rx coverage but she now has a new Ins so we will need to get copy of card and submit through her Ins if she has coverage. L/m for patient to return my call ?

## 2021-12-30 NOTE — Telephone Encounter (Signed)
Spoke to patient mother and advised with new Ins we have to try to go through for [place] coverage in order for PAP to continue. Patient mother does not have copy of card ?

## 2021-12-30 NOTE — Telephone Encounter (Signed)
Received a fax from patients pharmacy stating that the patient has requested a tub of triamcinolone cream. Is this appropriate to send in for the patient?  ?

## 2021-12-30 NOTE — Telephone Encounter (Signed)
I am ok with this,but just make sure that she does not use this medication every day and does not use it longer than 3 weeks in a row. Do not use triamcinolone on face, neck, groin, or armpit region. Do not give any refills though.

## 2022-01-18 ENCOUNTER — Other Ambulatory Visit: Payer: Self-pay | Admitting: *Deleted

## 2022-01-18 MED ORDER — ALBUTEROL SULFATE HFA 108 (90 BASE) MCG/ACT IN AERS: INHALATION_SPRAY | RESPIRATORY_TRACT | 1 refills | Status: DC

## 2022-01-18 MED ORDER — BUDESONIDE-FORMOTEROL FUMARATE 160-4.5 MCG/ACT IN AERO: 2.0000 | INHALATION_SPRAY | Freq: Two times a day (BID) | RESPIRATORY_TRACT | 12 refills | Status: DC

## 2022-01-18 MED ORDER — DUPIXENT 300 MG/2ML ~~LOC~~ SOSY: 600.0000 mg | PREFILLED_SYRINGE | Freq: Once | SUBCUTANEOUS | 11 refills | Status: AC

## 2022-01-18 NOTE — Telephone Encounter (Signed)
Spoke to patient she called to find out status of Dupixent. I had submitted PA but keeps coming back need to call [facility]. Reached out to them and they advised paid claim no Pa needed. Patient advised of Erx and signed up for copay card that will be emailed to her and [facility] will reach out to set up shipment to restart ?

## 2022-01-18 NOTE — Addendum Note (Signed)
Addended by: Devoria Glassing on: 01/18/2022 12:14 PM ? ? Modules accepted: Orders ? ?

## 2022-01-24 NOTE — Telephone Encounter (Signed)
Rx submitted to [facility] and pt mother advised ?

## 2022-01-30 NOTE — Telephone Encounter (Signed)
Spoke to mother and advised submit to [facility] for Dupixent. Not going through Dupixent [facility] if Ins will cover same ?

## 2022-02-06 ENCOUNTER — Encounter (HOSPITAL_COMMUNITY): Payer: Self-pay

## 2022-02-06 ENCOUNTER — Other Ambulatory Visit: Payer: Self-pay

## 2022-02-06 ENCOUNTER — Emergency Department (HOSPITAL_COMMUNITY)
Admission: EM | Admit: 2022-02-06 | Discharge: 2022-02-06 | Disposition: A | Discharge status: Home / Self Care | Payer: Managed Care, Other (non HMO) | Attending: Emergency Medicine | Admitting: Emergency Medicine

## 2022-02-06 DIAGNOSIS — R55 Syncope and collapse: Secondary | ICD-10-CM | POA: Insufficient documentation

## 2022-02-06 DIAGNOSIS — L299 Pruritus, unspecified: Secondary | ICD-10-CM | POA: Diagnosis not present

## 2022-02-06 DIAGNOSIS — Z91010 Allergy to peanuts: Secondary | ICD-10-CM | POA: Diagnosis not present

## 2022-02-06 DIAGNOSIS — R000 Tachycardia, unspecified: Secondary | ICD-10-CM | POA: Insufficient documentation

## 2022-02-06 DIAGNOSIS — F419 Anxiety disorder, unspecified: Secondary | ICD-10-CM | POA: Insufficient documentation

## 2022-02-06 DIAGNOSIS — R413 Other amnesia: Secondary | ICD-10-CM | POA: Diagnosis present

## 2022-02-06 LAB — RAPID URINE DRUG SCREEN, HOSP PERFORMED
Amphetamines: NOT DETECTED
Barbiturates: NOT DETECTED
Benzodiazepines: NOT DETECTED
Cocaine: NOT DETECTED
Opiates: NOT DETECTED
Tetrahydrocannabinol: NOT DETECTED

## 2022-02-06 LAB — PREGNANCY, URINE: Preg Test, Ur: NEGATIVE

## 2022-02-06 NOTE — Discharge Instructions (Signed)
Stay hydrated.  Avoid any drugs or alcohol in the near future.  Give your body time to metabolize any substances that you may have been accidentally or intentionally given to you last night.  Return to the emergency department for any further symptoms of concern.
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
## 2022-02-07 ENCOUNTER — Telehealth: Payer: Self-pay | Admitting: Family Medicine

## 2022-02-07 NOTE — Telephone Encounter (Signed)
Advised pt she would ntbs and offered appt but pt declined appointment at this time.

## 2022-04-02 ENCOUNTER — Other Ambulatory Visit: Payer: Self-pay | Admitting: Family

## 2022-04-02 NOTE — Telephone Encounter (Signed)
A prescription was sent on 01/18/22 with 1 refill. Has she already used these albuterol inhalers? If yes she needs to schedule a follow up appointment.

## 2022-04-04 ENCOUNTER — Other Ambulatory Visit: Payer: Self-pay

## 2022-04-04 MED ORDER — ALBUTEROL SULFATE HFA 108 (90 BASE) MCG/ACT IN AERS: INHALATION_SPRAY | RESPIRATORY_TRACT | 0 refills | Status: DC

## 2022-10-13 ENCOUNTER — Encounter: Payer: Self-pay | Admitting: Family Medicine

## 2022-10-13 ENCOUNTER — Ambulatory Visit (INDEPENDENT_AMBULATORY_CARE_PROVIDER_SITE_OTHER): Payer: Self-pay | Admitting: Family Medicine

## 2022-10-13 VITALS — BP 135/88 | HR 83 | Temp 98.7°F | Ht 61.0 in | Wt 202.0 lb

## 2022-10-13 DIAGNOSIS — J4520 Mild intermittent asthma, uncomplicated: Secondary | ICD-10-CM

## 2022-10-13 DIAGNOSIS — L2084 Intrinsic (allergic) eczema: Secondary | ICD-10-CM

## 2022-10-13 DIAGNOSIS — S86112A Strain of other muscle(s) and tendon(s) of posterior muscle group at lower leg level, left leg, initial encounter: Secondary | ICD-10-CM

## 2022-10-13 MED ORDER — TRIAMCINOLONE ACETONIDE 0.1 % EX CREA: TOPICAL_CREAM | CUTANEOUS | 0 refills | Status: DC

## 2022-10-13 MED ORDER — BUDESONIDE-FORMOTEROL FUMARATE 160-4.5 MCG/ACT IN AERO: 2.0000 | INHALATION_SPRAY | Freq: Two times a day (BID) | RESPIRATORY_TRACT | 12 refills | Status: DC

## 2022-10-13 MED ORDER — PREDNISONE 10 MG (21) PO TBPK: ORAL_TABLET | ORAL | 0 refills | Status: DC

## 2022-10-13 MED ORDER — ALBUTEROL SULFATE HFA 108 (90 BASE) MCG/ACT IN AERS: INHALATION_SPRAY | RESPIRATORY_TRACT | 1 refills | Status: DC

## 2022-10-13 MED ORDER — ALBUTEROL SULFATE (2.5 MG/3ML) 0.083% IN NEBU: INHALATION_SOLUTION | RESPIRATORY_TRACT | 1 refills | Status: AC

## 2022-10-13 MED ORDER — MONTELUKAST SODIUM 10 MG PO TABS: 10.0000 mg | ORAL_TABLET | Freq: Every evening | ORAL | 3 refills | Status: DC

## 2022-10-13 NOTE — Patient Instructions (Signed)
[email or web address]

## 2022-10-13 NOTE — Progress Notes (Signed)
Subjective: CC: Follow-up asthma, eczema PCP: Janora Norlander, DO Casey Weaver is a 22 y.o. female presenting to clinic today for:  1.  Asthma and eczema Patient is using the Symbicort again because she recently had a URI.  Was relying on her albuterol quite a bit.  She needs refills on Singulair and Kenalog as well.  Eczema has flared since she discontinued Dupixent.  It was several thousand dollars and she just cannot afford it.  Admits to ongoing itching and excoriation.  She shows me her elbows today  2.  Fall Patient reports that she slipped and fell.  She hyperflexed her left leg and notes that she has had ongoing posterior left calf muscle pain now.  She used OTC analgesics including NSAIDs but this really did not help.  Denies having felt a pop.  Pain can be present with certain movements.  No sensory changes or weakness reported   ROS: Per HPI  Allergies  Allergen Reactions   Peanut-Containing Drug Products Swelling    PEANUT BUTTER: throat swells   Septra [Sulfamethoxazole-Trimethoprim] Rash   Past Medical History:  Diagnosis Date   Allergic rhinoconjunctivitis    Asthma    Attention deficit disorder (ADD)    Eczema    Food allergy     Current Outpatient Medications:    albuterol (PROVENTIL) (2.5 MG/3ML) 0.083% nebulizer solution, INHALE 1 VIAL VIA NEBULIZER EVERY 6 HOURS, Disp: 375 mL, Rfl: 1   albuterol (VENTOLIN HFA) 108 (90 Base) MCG/ACT inhaler, INHALE 2 PUFFS BY MOUTH EVERY 4-6 HOURS AS NEEDED FOR COUGH AND FOR WHEEZING, Disp: 7 each, Rfl: 0   APPLE CIDER VINEGAR PO, Take by mouth. Gummies-takes 1 daily, Disp: , Rfl:    budesonide-formoterol (SYMBICORT) 160-4.5 MCG/ACT inhaler, Inhale 2 puffs into the lungs 2 (two) times daily., Disp: 1 each, Rfl: 12   DUPIXENT 300 MG/2ML prefilled syringe, Inject into the skin., Disp: , Rfl:    EPINEPHrine (AUVI-Q) 0.3 mg/0.3 mL IJ SOAJ injection, Use as directed for severe allergic reaction PUT ON FILE, Disp: 2 each,  Rfl: 1   levocetirizine (XYZAL) 5 MG tablet, Take 1 tablet (5 mg total) by mouth every evening., Disp: 90 tablet, Rfl: 1   montelukast (SINGULAIR) 10 MG tablet, Take 1 tablet (10 mg total) by mouth every evening., Disp: 90 tablet, Rfl: 1   omeprazole (PRILOSEC) 20 MG capsule, Take 1 capsule (20 mg total) by mouth daily., Disp: 90 capsule, Rfl: 3   triamcinolone cream (KENALOG) 0.1 %, Use 1 application twice a day as needed to red itchy areas. Do not use on face, neck, groin, or armpit region, Disp: 453.6 g, Rfl: 0   triamcinolone ointment (KENALOG) 0.1 %, Use 1 application twice a day as needed to red itchy areas.  Do not use on face, neck, groin, or armpit region, Disp: 30 g, Rfl: 2 Social History   Socioeconomic History   Marital status: Single    Spouse name: Not on file   Number of children: Not on file   Years of education: Not on file   Highest education level: Not on file  Occupational History   Not on file  Tobacco Use   Smoking status: Never   Smokeless tobacco: Never  Vaping Use   Vaping Use: Never used  Substance and Sexual Activity   Alcohol use: Yes   Drug use: No   Sexual activity: Yes    Birth control/protection: None  Other Topics Concern   Not on file  Social History Narrative   Not on file   Social Determinants of Health   Financial Resource Strain: Not on file  Food Insecurity: No Food Insecurity (04/28/2021)   Hunger Vital Sign    Worried About Running Out of Food in the Last Year: Never true    Ran Out of Food in the Last Year: Never true  Transportation Needs: No Transportation Needs (04/28/2021)   PRAPARE - Administrator, Civil Service (Medical): No    Lack of Transportation (Non-Medical): No  Physical Activity: Sufficiently Active (04/28/2021)   Exercise Vital Sign    Days of Exercise per Week: 5 days    Minutes of Exercise per Session: 40 min  Stress: No Stress Concern Present (04/28/2021)   [employer] of Occupational Health -  Occupational Stress Questionnaire    Feeling of Stress : Not at all  Social Connections: Socially Isolated (04/28/2021)   Social Connection and Isolation Panel [NHANES]    Frequency of Communication with Friends and Family: More than three times a week    Frequency of Social Gatherings with Friends and Family: More than three times a week    Attends Religious Services: Never    Database administrator or Organizations: No    Attends Banker Meetings: Never    Marital Status: Never married  Intimate Partner Violence: Not At Risk (04/28/2021)   Humiliation, Afraid, Rape, and Kick questionnaire    Fear of Current or Ex-Partner: No    Emotionally Abused: No    Physically Abused: No    Sexually Abused: No   Family History  Problem Relation Age of Onset   Hypertension Father    Eczema Mother    Allergic rhinitis Mother    Angioedema Neg Hx    Asthma Neg Hx    Immunodeficiency Neg Hx    Urticaria Neg Hx     Objective: Office vital signs reviewed. BP 135/88   Pulse 83   Temp 98.7 F (37.1 C)   Ht 5\' 1"  (1.549 m)   Wt 202 lb (91.6 kg)   LMP 09/11/2022   SpO2 100%   BMI 38.17 kg/m   Physical Examination:  General: Awake, alert, well nourished, No acute distress HEENT: sclera white, mucus membranes moist Cardio: regular rate and rhythm, S1S2 heard, no murmurs appreciated Pulm: Mild expiratory wheezes noted.  No rhonchi or rales.  Normal work of breathing on room air Skin: Excoriated rash along bilateral cubital fossa's MSK: Tenderness palpation along the insertion of the gastroc on the left.  There are no palpable defects.  She has full plantarflexion  Assessment/ Plan: 22 y.o. female   Mild intermittent chronic asthma without complication - Plan: albuterol (PROVENTIL) (2.5 MG/3ML) 0.083% nebulizer solution, albuterol (VENTOLIN HFA) 108 (90 Base) MCG/ACT inhaler, budesonide-formoterol (SYMBICORT) 160-4.5 MCG/ACT inhaler, montelukast (SINGULAIR) 10 MG tablet,  predniSONE (STERAPRED UNI-PAK 21 TAB) 10 MG (21) TBPK tablet  Intrinsic eczema - Plan: triamcinolone cream (KENALOG) 0.1 %, predniSONE (STERAPRED UNI-PAK 21 TAB) 10 MG (21) TBPK tablet  Strain of gastrocnemius muscle of left lower extremity, initial encounter - Plan: predniSONE (STERAPRED UNI-PAK 21 TAB) 10 MG (21) TBPK tablet  Renewal of inhalers given.  I placed her on a prednisone Dosepak for what I think is a strain of gastrocnemius muscle.  This is been refractory to OTC analgesics and NSAIDs.  May need to consider referral to physical therapy but this is somewhat limited due to insurance issues.  Eczema is chronic and stable.  Kenalog given.  Follow-up as needed  No orders of the defined types were placed in this encounter.  No orders of the defined types were placed in this encounter.    Janora Norlander, DO [place] [phone]

## 2022-12-19 ENCOUNTER — Ambulatory Visit
Admission: EM | Admit: 2022-12-19 | Discharge: 2022-12-19 | Disposition: A | Discharge status: Home / Self Care | Payer: Self-pay | Attending: Family Medicine | Admitting: Family Medicine

## 2022-12-19 DIAGNOSIS — J309 Allergic rhinitis, unspecified: Secondary | ICD-10-CM

## 2022-12-19 MED ORDER — PROMETHAZINE-DM 6.25-15 MG/5ML PO SYRP: 5.0000 mL | ORAL_SOLUTION | Freq: Four times a day (QID) | ORAL | 0 refills | Status: DC | PRN

## 2022-12-19 MED ORDER — FLUTICASONE PROPIONATE 50 MCG/ACT NA SUSP: 1.0000 | Freq: Two times a day (BID) | NASAL | 2 refills | Status: DC

## 2022-12-19 MED ORDER — PSEUDOEPHEDRINE HCL 60 MG PO TABS: 60.0000 mg | ORAL_TABLET | Freq: Four times a day (QID) | ORAL | 0 refills | Status: DC | PRN

## 2022-12-19 NOTE — ED Triage Notes (Signed)
Pt reports her head is stopped up, chest congestion, and green mucus x 3 days. Took reg allergy meds.

## 2022-12-19 NOTE — ED Provider Notes (Signed)
[facility]    CSN: [id number] Arrival date & time: 12/19/22  0950      History   Chief Complaint No chief complaint on file.   HPI Casey Weaver is a 22 y.o. female.   Patient presenting today with 3-day history of nasal congestion, productive cough, sinus pressure.  Denies fever, chills, chest pain, shortness of breath, abdominal pain, nausea vomiting or diarrhea.  So far taking Zyrtec for seasonal allergies, otherwise not tried anything for symptoms.  Does have a history of asthma on inhalers consistently.    Past Medical History:  Diagnosis Date   Allergic rhinoconjunctivitis    Asthma    Attention deficit disorder (ADD)    Eczema    Food allergy     Patient Active Problem List   Diagnosis Date Noted   Rh negative state in antepartum period 06/27/2020   First trimester pregnancy 06/26/2020   Depression, major, single episode, moderate 04/22/2018   Gastroesophageal reflux disease without esophagitis 06/16/2017   Eczema 11/06/2016   Chronic asthma 06/30/2016   Intrinsic eczema 06/30/2016   Attention deficit hyperactivity disorder (ADHD), predominantly inattentive type 06/30/2016    Past Surgical History:  Procedure Laterality Date   NO PAST SURGERIES      OB History     Gravida  1   Para      Term      Preterm      AB  1   Living         SAB  1   IAB      Ectopic      Multiple      Live Births               Home Medications    Prior to Admission medications   Medication Sig Start Date End Date Taking? Authorizing Provider  fluticasone (FLONASE) 50 MCG/ACT nasal spray Place 1 spray into both nostrils 2 (two) times daily. 12/19/22  Yes Volney American, PA-C  promethazine-dextromethorphan (PROMETHAZINE-DM) 6.25-15 MG/5ML syrup Take 5 mLs by mouth 4 (four) times daily as needed. 12/19/22  Yes Volney American, PA-C  pseudoephedrine (SUDAFED) 60 MG tablet Take 1 tablet (60 mg total) by mouth every 6 (six) hours as  needed for congestion. 12/19/22  Yes Volney American, PA-C  albuterol (PROVENTIL) (2.5 MG/3ML) 0.083% nebulizer solution INHALE 1 VIAL VIA NEBULIZER EVERY 6 HOURS 10/13/22   Ronnie Doss M, DO  albuterol (VENTOLIN HFA) 108 (90 Base) MCG/ACT inhaler INHALE 2 PUFFS BY MOUTH EVERY 4-6 HOURS AS NEEDED FOR COUGH AND FOR WHEEZING 10/13/22   Ronnie Doss M, DO  APPLE CIDER VINEGAR PO Take by mouth. Gummies-takes 1 daily    [provider]  budesonide-formoterol (SYMBICORT) 160-4.5 MCG/ACT inhaler Inhale 2 puffs into the lungs 2 (two) times daily. 10/13/22   Janora Norlander, DO  DUPIXENT 300 MG/2ML prefilled syringe Inject into the skin. 03/07/22   [provider]  EPINEPHrine (AUVI-Q) 0.3 mg/0.3 mL IJ SOAJ injection Use as directed for severe allergic reaction PUT ON FILE 04/07/21   Ronnie Doss M, DO  levocetirizine (XYZAL) 5 MG tablet Take 1 tablet (5 mg total) by mouth every evening. 09/05/21   Althea Charon, FNP  montelukast (SINGULAIR) 10 MG tablet Take 1 tablet (10 mg total) by mouth every evening. 10/13/22   Janora Norlander, DO  omeprazole (PRILOSEC) 20 MG capsule Take 1 capsule (20 mg total) by mouth daily. 04/07/21   Janora Norlander, DO  predniSONE (STERAPRED UNI-PAK 21 TAB) 10 MG (21) TBPK tablet As directed x 6 days 10/13/22   Ronnie Doss M, DO  triamcinolone cream (KENALOG) 0.1 % Use 1 application twice a day as needed to red itchy areas. Do not use on face, neck, groin, or armpit region 10/13/22   Janora Norlander, DO    Family History Family History  Problem Relation Age of Onset   Hypertension Father    Eczema Mother    Allergic rhinitis Mother    Angioedema Neg Hx    Asthma Neg Hx    Immunodeficiency Neg Hx    Urticaria Neg Hx     Social History Social History   Tobacco Use   Smoking status: Never   Smokeless tobacco: Never  Vaping Use   Vaping Use: Never used  Substance Use Topics   Alcohol use: Yes   Drug use: No      Allergies   Peanut-containing drug products and Septra [sulfamethoxazole-trimethoprim]   Review of Systems Review of Systems Per HPI  Physical Exam Triage Vital Signs ED Triage Vitals  Enc Vitals Group     BP 12/19/22 0956 (!) 131/92     Pulse Rate 12/19/22 0956 (!) 125     Resp 12/19/22 0956 20     Temp 12/19/22 0956 98.4 F (36.9 C)     Temp Source 12/19/22 0956 Oral     SpO2 12/19/22 0956 96 %     Weight --      Height --      Head Circumference --      Peak Flow --      Pain Score 12/19/22 0959 0     Pain Loc --      Pain Edu? --      Excl. in [place]? --    No data found.  Updated Vital Signs BP (!) 131/92 (BP Location: Right Arm)   Pulse (!) 125   Temp 98.4 F (36.9 C) (Oral)   Resp 20   LMP 12/07/2022   SpO2 96%   Visual Acuity Right Eye Distance:   Left Eye Distance:   Bilateral Distance:    Right Eye Near:   Left Eye Near:    Bilateral Near:     Physical Exam Vitals and nursing note reviewed.  Constitutional:      Appearance: Normal appearance.  HENT:     Head: Atraumatic.     Right Ear: Tympanic membrane and external ear normal.     Left Ear: Tympanic membrane and external ear normal.     Nose: Rhinorrhea present.     Mouth/Throat:     Mouth: Mucous membranes are moist.     Pharynx: Posterior oropharyngeal erythema present.  Eyes:     Extraocular Movements: Extraocular movements intact.     Conjunctiva/sclera: Conjunctivae normal.  Cardiovascular:     Rate and Rhythm: Normal rate and regular rhythm.     Heart sounds: Normal heart sounds.  Pulmonary:     Effort: Pulmonary effort is normal.     Breath sounds: Normal breath sounds. No wheezing.  Musculoskeletal:        General: Normal range of motion.     Cervical back: Normal range of motion and neck supple.  Skin:    General: Skin is warm and dry.  Neurological:     Mental Status: She is alert and oriented to person, place, and time.  Psychiatric:        Mood and Affect: Mood  normal.        Thought Content: Thought content normal.      UC Treatments / Results  Labs (all labs ordered are listed, but only abnormal results are displayed) Labs Reviewed - No data to display  EKG   Radiology No results found.  Procedures Procedures (including critical care time)  Medications Ordered in UC Medications - No data to display  Initial Impression / Assessment and Plan / UC Course  I have reviewed the triage vital signs and the nursing notes.  Pertinent labs & imaging results that were available during my care of the patient were reviewed by me and considered in my medical decision making (see chart for details).     Suspect seasonal allergy exacerbation versus new viral illness.  Add Flonase, Phenergan DM, Sudafed and sinus rinses to daily antihistamine regimen.  Discussed supportive care and return precautions.  Final Clinical Impressions(s) / UC Diagnoses   Final diagnoses:  Allergic sinusitis   Discharge Instructions   None    ED Prescriptions     Medication Sig Dispense Auth. Provider   fluticasone (FLONASE) 50 MCG/ACT nasal spray Place 1 spray into both nostrils 2 (two) times daily. [place], [place]   promethazine-dextromethorphan (PROMETHAZINE-DM) 6.25-15 MG/5ML syrup Take 5 mLs by mouth 4 (four) times daily as needed. 100 mL Volney American, PA-C   pseudoephedrine (SUDAFED) 60 MG tablet Take 1 tablet (60 mg total) by mouth every 6 (six) hours as needed for congestion. 20 tablet Volney American, [place]      PDMP not reviewed this encounter.   Volney American, [place] 12/19/22 1056

## 2023-03-14 ENCOUNTER — Other Ambulatory Visit: Payer: Self-pay | Admitting: Family Medicine

## 2023-03-14 DIAGNOSIS — J4520 Mild intermittent asthma, uncomplicated: Secondary | ICD-10-CM

## 2023-05-04 ENCOUNTER — Encounter: Payer: Self-pay | Admitting: Family Medicine

## 2023-05-04 ENCOUNTER — Ambulatory Visit (INDEPENDENT_AMBULATORY_CARE_PROVIDER_SITE_OTHER): Payer: 59 | Admitting: Family Medicine

## 2023-05-04 VITALS — BP 139/84 | HR 114 | Temp 98.5°F | Ht 61.0 in | Wt 209.0 lb

## 2023-05-04 DIAGNOSIS — Z9149 Other personal history of psychological trauma, not elsewhere classified: Secondary | ICD-10-CM

## 2023-05-04 DIAGNOSIS — F410 Panic disorder [episodic paroxysmal anxiety] without agoraphobia: Secondary | ICD-10-CM | POA: Diagnosis not present

## 2023-05-04 DIAGNOSIS — F411 Generalized anxiety disorder: Secondary | ICD-10-CM

## 2023-05-04 MED ORDER — HYDROXYZINE HCL 25 MG PO TABS
12.5000 mg | ORAL_TABLET | Freq: Three times a day (TID) | ORAL | 0 refills | Status: AC | PRN
Start: 2023-05-04 — End: ?

## 2023-05-04 MED ORDER — DESVENLAFAXINE SUCCINATE ER 50 MG PO TB24
50.0000 mg | ORAL_TABLET | Freq: Every day | ORAL | 1 refills | Status: AC
Start: 2023-05-04 — End: ?
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
## 2023-06-18 ENCOUNTER — Telehealth (INDEPENDENT_AMBULATORY_CARE_PROVIDER_SITE_OTHER): Payer: 59 | Admitting: Family Medicine

## 2023-06-18 ENCOUNTER — Encounter: Payer: Self-pay | Admitting: Family Medicine

## 2023-06-18 DIAGNOSIS — F411 Generalized anxiety disorder: Secondary | ICD-10-CM | POA: Diagnosis not present

## 2023-06-18 DIAGNOSIS — F410 Panic disorder [episodic paroxysmal anxiety] without agoraphobia: Secondary | ICD-10-CM

## 2023-06-18 DIAGNOSIS — J4520 Mild intermittent asthma, uncomplicated: Secondary | ICD-10-CM

## 2023-06-18 DIAGNOSIS — Z91010 Allergy to peanuts: Secondary | ICD-10-CM

## 2023-06-18 DIAGNOSIS — Z9149 Other personal history of psychological trauma, not elsewhere classified: Secondary | ICD-10-CM

## 2023-06-18 MED ORDER — EPINEPHRINE 0.3 MG/0.3ML IJ SOAJ
INTRAMUSCULAR | 1 refills | Status: AC
Start: 2023-06-18 — End: ?

## 2023-06-18 MED ORDER — BUDESONIDE-FORMOTEROL FUMARATE 160-4.5 MCG/ACT IN AERO
2.0000 | INHALATION_SPRAY | Freq: Two times a day (BID) | RESPIRATORY_TRACT | 12 refills | Status: DC
Start: 2023-06-18 — End: 2024-04-28

## 2023-06-18 MED ORDER — VENTOLIN HFA 108 (90 BASE) MCG/ACT IN AERS: INHALATION_SPRAY | RESPIRATORY_TRACT | 0 refills | Status: DC

## 2023-06-18 MED ORDER — DESVENLAFAXINE SUCCINATE ER 50 MG PO TB24: 50.0000 mg | ORAL_TABLET | Freq: Every day | ORAL | 3 refills | Status: DC

## 2023-06-18 MED ORDER — MONTELUKAST SODIUM 10 MG PO TABS: 10.0000 mg | ORAL_TABLET | Freq: Every evening | ORAL | 3 refills | Status: DC

## 2023-06-18 MED ORDER — HYDROXYZINE HCL 25 MG PO TABS
12.5000 mg | ORAL_TABLET | Freq: Three times a day (TID) | ORAL | 3 refills | Status: DC | PRN
Start: 2023-06-18 — End: 2024-03-12
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
## 2023-06-27 ENCOUNTER — Encounter: Payer: Self-pay | Admitting: Family Medicine

## 2023-06-27 DIAGNOSIS — Z9149 Other personal history of psychological trauma, not elsewhere classified: Secondary | ICD-10-CM

## 2023-06-27 DIAGNOSIS — F410 Panic disorder [episodic paroxysmal anxiety] without agoraphobia: Secondary | ICD-10-CM

## 2023-07-19 ENCOUNTER — Encounter: Payer: Self-pay | Admitting: Emergency Medicine

## 2023-07-19 ENCOUNTER — Ambulatory Visit
Admission: EM | Admit: 2023-07-19 | Discharge: 2023-07-19 | Disposition: A | Discharge status: Home / Self Care | Payer: 59 | Attending: Nurse Practitioner | Admitting: Nurse Practitioner

## 2023-07-19 DIAGNOSIS — B349 Viral infection, unspecified: Secondary | ICD-10-CM | POA: Diagnosis not present

## 2023-07-19 DIAGNOSIS — J029 Acute pharyngitis, unspecified: Secondary | ICD-10-CM

## 2023-07-19 LAB — POCT RAPID STREP A (OFFICE): Rapid Strep A Screen: NEGATIVE

## 2023-07-19 MED ORDER — LIDOCAINE VISCOUS HCL 2 % MT SOLN: OROMUCOSAL | 0 refills | Status: DC

## 2023-07-19 NOTE — ED Provider Notes (Signed)
[facility]    CSN: [id number] Arrival date & time: 07/19/23  1205      History   Chief Complaint No chief complaint on file.   HPI Casey Weaver is a 22 y.o. female.   The history is provided by the patient.   Patient presents for complaints of fever, fatigue, and sore throat.  Symptoms started over the past 24 hours.  Patient denies headache, nasal congestion, runny nose, cough, chest pain, abdominal pain, nausea, vomiting, diarrhea, or rash.  Patient denies any obvious known sick contacts.  Reports she has been taking Tylenol and Sudafed for her symptoms.  Patient states she coughed up a "mucous plug" today.  Past Medical History:  Diagnosis Date   Allergic rhinoconjunctivitis    Asthma    Attention deficit disorder (ADD)    Eczema    Food allergy     Patient Active Problem List   Diagnosis Date Noted   Rh negative state in antepartum period 06/27/2020   First trimester pregnancy 06/26/2020   Lesion of tonsil 03/05/2020   Depression, major, single episode, moderate (HCC) 04/22/2018   Gastroesophageal reflux disease without esophagitis 06/16/2017   Eczema 11/06/2016   Chronic asthma 06/30/2016   Intrinsic eczema 06/30/2016   Attention deficit hyperactivity disorder (ADHD), predominantly inattentive type 06/30/2016    Past Surgical History:  Procedure Laterality Date   NO PAST SURGERIES      OB History     Gravida  1   Para      Term      Preterm      AB  1   Living         SAB  1   IAB      Ectopic      Multiple      Live Births               Home Medications    Prior to Admission medications   Medication Sig Start Date End Date Taking? Authorizing Provider  lidocaine (XYLOCAINE) 2 % solution Gargle and spit 5mL every 6 hours as needed for throat pain or discomfort. 07/19/23  Yes Leath-Warren, Sadie Haber, NP  albuterol (PROVENTIL) (2.5 MG/3ML) 0.083% nebulizer solution INHALE 1 VIAL VIA NEBULIZER EVERY 6 HOURS 10/13/22    Delynn Flavin M, DO  APPLE CIDER VINEGAR PO Take by mouth. Gummies-takes 1 daily    [provider]  budesonide-formoterol (SYMBICORT) 160-4.5 MCG/ACT inhaler Inhale 2 puffs into the lungs 2 (two) times daily. 06/18/23   Raliegh Ip, DO  desvenlafaxine (PRISTIQ) 50 MG 24 hr tablet Take 1 tablet (50 mg total) by mouth daily. 06/18/23   Raliegh Ip, DO  EPINEPHrine (AUVI-Q) 0.3 mg/0.3 mL IJ SOAJ injection Use as directed for severe allergic reaction PUT ON FILE 06/18/23   Delynn Flavin M, DO  hydrOXYzine (ATARAX) 25 MG tablet Take 0.5-2 tablets (12.5-50 mg total) by mouth every 8 (eight) hours as needed (sleep/ anxiety/ panic). 06/18/23   Raliegh Ip, DO  levocetirizine (XYZAL) 5 MG tablet Take 1 tablet (5 mg total) by mouth every evening. 09/05/21   Nehemiah Settle, FNP  montelukast (SINGULAIR) 10 MG tablet Take 1 tablet (10 mg total) by mouth every evening. 06/18/23   Raliegh Ip, DO  omeprazole (PRILOSEC) 20 MG capsule Take 1 capsule (20 mg total) by mouth daily. 04/07/21   Raliegh Ip, DO  triamcinolone cream (KENALOG) 0.1 % Use 1 application twice a day as needed to  red itchy areas. Do not use on face, neck, groin, or armpit region 10/13/22   Delynn Flavin M, DO  VENTOLIN HFA 108 (90 Base) MCG/ACT inhaler INHALE 2 PUFFS BY MOUTH EVERY 4 TO 6 HOURS AS NEEDED FOR COUGH AND FOR WHEEZING 06/18/23   Raliegh Ip, DO    Family History Family History  Problem Relation Age of Onset   Hypertension Father    Eczema Mother    Allergic rhinitis Mother    Angioedema Neg Hx    Asthma Neg Hx    Immunodeficiency Neg Hx    Urticaria Neg Hx     Social History Social History   Tobacco Use   Smoking status: Never   Smokeless tobacco: Never  Vaping Use   Vaping status: Never Used  Substance Use Topics   Alcohol use: Yes   Drug use: No     Allergies   Peanut-containing drug products and Septra  [sulfamethoxazole-trimethoprim]   Review of Systems Review of Systems Per HPI  Physical Exam Triage Vital Signs ED Triage Vitals  Encounter Vitals Group     BP 07/19/23 1211 121/79     Systolic BP Percentile --      Diastolic BP Percentile --      Pulse Rate 07/19/23 1211 (!) 113     Resp 07/19/23 1211 18     Temp 07/19/23 1211 99 F (37.2 C)     Temp Source 07/19/23 1211 Oral     SpO2 07/19/23 1211 98 %     Weight --      Height --      Head Circumference --      Peak Flow --      Pain Score 07/19/23 1212 7     Pain Loc --      Pain Education --      Exclude from Growth Chart --    No data found.  Updated Vital Signs BP 121/79 (BP Location: Right Arm)   Pulse (!) 113   Temp 99 F (37.2 C) (Oral)   Resp 18   LMP 07/17/2023 (Exact Date)   SpO2 98%   Visual Acuity Right Eye Distance:   Left Eye Distance:   Bilateral Distance:    Right Eye Near:   Left Eye Near:    Bilateral Near:     Physical Exam Vitals and nursing note reviewed.  Constitutional:      General: She is not in acute distress.    Appearance: Normal appearance.  HENT:     Head: Normocephalic.     Right Ear: Tympanic membrane, ear canal and external ear normal.     Left Ear: Tympanic membrane, ear canal and external ear normal.     Nose: Nose normal.     Mouth/Throat:     Lips: Pink.     Mouth: Mucous membranes are moist.     Pharynx: Uvula midline. Pharyngeal swelling, posterior oropharyngeal erythema and postnasal drip present. No oropharyngeal exudate or uvula swelling.     Tonsils: No tonsillar exudate. 1+ on the right. 1+ on the left.     Comments: Cobblestoning present to posterior oropharynx  Eyes:     Extraocular Movements: Extraocular movements intact.     Conjunctiva/sclera: Conjunctivae normal.     Pupils: Pupils are equal, round, and reactive to light.  Cardiovascular:     Rate and Rhythm: Normal rate and regular rhythm.     Pulses: Normal pulses.     Heart sounds: Normal  heart sounds.  Pulmonary:     Effort: Pulmonary effort is normal.     Breath sounds: Normal breath sounds.  Abdominal:     General: Bowel sounds are normal.     Palpations: Abdomen is soft.     Tenderness: There is no abdominal tenderness.  Musculoskeletal:     Cervical back: Normal range of motion.  Lymphadenopathy:     Cervical: No cervical adenopathy.  Skin:    General: Skin is warm and dry.  Neurological:     General: No focal deficit present.     Mental Status: She is alert and oriented to person, place, and time.  Psychiatric:        Mood and Affect: Mood normal.        Behavior: Behavior normal.      UC Treatments / Results  Labs (all labs ordered are listed, but only abnormal results are displayed) Labs Reviewed  CULTURE, GROUP A STREP [facility])  POCT RAPID STREP A (OFFICE)    EKG   Radiology No results found.  Procedures Procedures (including critical care time)  Medications Ordered in UC Medications - No data to display  Initial Impression / Assessment and Plan / UC Course  I have reviewed the triage vital signs and the nursing notes.  Pertinent labs & imaging results that were available during my care of the patient were reviewed by me and considered in my medical decision making (see chart for details).  Rapid strep test was negative, throat culture is pending.  Suspect patient has a viral illness at this time.  Will provide symptomatic treatment with viscous lidocaine 2% for patient to gargle and spit for throat pain or discomfort.  Supportive care recommendations were provided and discussed with the patient to include over-the-counter analgesics, warm salt water gargles, increasing fluids and allowing for plenty of rest.  Discussed viral etiology with the patient and when follow-up would be indicated.  Patient advised she may follow-up in this clinic or with her primary care physician for further evaluation.  Patient is in agreement with this plan of care  and verbalizes understanding.  All questions were answered.  Patient stable for discharge.   Final Clinical Impressions(s) / UC Diagnoses   Final diagnoses:  Viral illness  Sore throat     Discharge Instructions      The rapid strep test was negative.  A throat culture is pending.  You will be contacted if the pending test result is abnormal.  You also have access to the results via MyChart. Take medication as prescribed. May continue over-the-counter Tylenol as needed for pain, fever, or general discomfort. Increase fluids and allow for plenty of rest. Warm salt water gargles as needed for throat pain or discomfort. Recommend throat lozenges or Chloraseptic throat spray to help with throat pain or discomfort.  You should have a soft diet while throat pain persist to include soup, broth, yogurt, pudding, or Jell-O. As discussed, a viral illness may persist for up to 10 days.  If symptoms suddenly worsen before that time, or extend beyond that time, you may follow-up in this clinic or with your primary care physician for further evaluation. Follow-up as needed.     ED Prescriptions     Medication Sig Dispense Auth. Provider   lidocaine (XYLOCAINE) 2 % solution Gargle and spit 5mL every 6 hours as needed for throat pain or discomfort. 100 mL Leath-Warren, Sadie Haber, NP      PDMP not reviewed this encounter.  Abran Cantor, NP 07/19/23 1243

## 2023-07-19 NOTE — ED Triage Notes (Signed)
Sore throat yesterday with fever.  States coughed up a "mucus plug" today.

## 2023-07-19 NOTE — Discharge Instructions (Signed)
The rapid strep test was negative.  A throat culture is pending.  You will be contacted if the pending test result is abnormal.  You also have access to the results via MyChart. Take medication as prescribed. May continue over-the-counter Tylenol as needed for pain, fever, or general discomfort. Increase fluids and allow for plenty of rest. Warm salt water gargles as needed for throat pain or discomfort. Recommend throat lozenges or Chloraseptic throat spray to help with throat pain or discomfort.  You should have a soft diet while throat pain persist to include soup, broth, yogurt, pudding, or Jell-O. As discussed, a viral illness may persist for up to 10 days.  If symptoms suddenly worsen before that time, or extend beyond that time, you may follow-up in this clinic or with your primary care physician for further evaluation. Follow-up as needed.

## 2023-07-21 ENCOUNTER — Ambulatory Visit
Admission: EM | Admit: 2023-07-21 | Discharge: 2023-07-21 | Disposition: A | Discharge status: Home / Self Care | Payer: 59 | Attending: Family Medicine | Admitting: Family Medicine

## 2023-07-21 DIAGNOSIS — J0300 Acute streptococcal tonsillitis, unspecified: Secondary | ICD-10-CM | POA: Diagnosis not present

## 2023-07-21 MED ORDER — AMOXICILLIN 875 MG PO TABS: 875.0000 mg | ORAL_TABLET | Freq: Two times a day (BID) | ORAL | 0 refills | Status: DC

## 2023-07-21 NOTE — ED Provider Notes (Signed)
[facility]    CSN: [id number] Arrival date & time: 07/21/23  0815      History   Chief Complaint No chief complaint on file.   HPI Casey Weaver is a 22 y.o. female.   Patient presenting today with almost a week of ongoing and worsening sore swollen feeling throat, fatigue, headache.  Was seen on 07/19/2023 and tested negative for strep.  Culture is still pending.  She was given viscous lidocaine which she states is not even helping at this point.  Symptoms have significantly worsened over the last 2 days.  She is having difficulty eating and drinking due to the swelling.  Denies fever, chills, difficulty breathing, vomiting, rashes.    Past Medical History:  Diagnosis Date   Allergic rhinoconjunctivitis    Asthma    Attention deficit disorder (ADD)    Eczema    Food allergy     Patient Active Problem List   Diagnosis Date Noted   Rh negative state in antepartum period 06/27/2020   First trimester pregnancy 06/26/2020   Lesion of tonsil 03/05/2020   Depression, major, single episode, moderate (HCC) 04/22/2018   Gastroesophageal reflux disease without esophagitis 06/16/2017   Eczema 11/06/2016   Chronic asthma 06/30/2016   Intrinsic eczema 06/30/2016   Attention deficit hyperactivity disorder (ADHD), predominantly inattentive type 06/30/2016    Past Surgical History:  Procedure Laterality Date   NO PAST SURGERIES      OB History     Gravida  1   Para      Term      Preterm      AB  1   Living         SAB  1   IAB      Ectopic      Multiple      Live Births               Home Medications    Prior to Admission medications   Medication Sig Start Date End Date Taking? Authorizing Provider  amoxicillin (AMOXIL) 875 MG tablet Take 1 tablet (875 mg total) by mouth 2 (two) times daily. 07/21/23  Yes Particia Nearing, PA-C  albuterol (PROVENTIL) (2.5 MG/3ML) 0.083% nebulizer solution INHALE 1 VIAL VIA NEBULIZER EVERY 6 HOURS  10/13/22   Delynn Flavin M, DO  APPLE CIDER VINEGAR PO Take by mouth. Gummies-takes 1 daily    [provider]  budesonide-formoterol (SYMBICORT) 160-4.5 MCG/ACT inhaler Inhale 2 puffs into the lungs 2 (two) times daily. 06/18/23   Raliegh Ip, DO  desvenlafaxine (PRISTIQ) 50 MG 24 hr tablet Take 1 tablet (50 mg total) by mouth daily. 06/18/23   Raliegh Ip, DO  EPINEPHrine (AUVI-Q) 0.3 mg/0.3 mL IJ SOAJ injection Use as directed for severe allergic reaction PUT ON FILE 06/18/23   Delynn Flavin M, DO  hydrOXYzine (ATARAX) 25 MG tablet Take 0.5-2 tablets (12.5-50 mg total) by mouth every 8 (eight) hours as needed (sleep/ anxiety/ panic). 06/18/23   Raliegh Ip, DO  levocetirizine (XYZAL) 5 MG tablet Take 1 tablet (5 mg total) by mouth every evening. 09/05/21   Nehemiah Settle, FNP  lidocaine (XYLOCAINE) 2 % solution Gargle and spit 5mL every 6 hours as needed for throat pain or discomfort. 07/19/23   Leath-Warren, Sadie Haber, NP  montelukast (SINGULAIR) 10 MG tablet Take 1 tablet (10 mg total) by mouth every evening. 06/18/23   Raliegh Ip, DO  omeprazole (PRILOSEC) 20 MG capsule Take  1 capsule (20 mg total) by mouth daily. 04/07/21   Raliegh Ip, DO  triamcinolone cream (KENALOG) 0.1 % Use 1 application twice a day as needed to red itchy areas. Do not use on face, neck, groin, or armpit region 10/13/22   Delynn Flavin M, DO  VENTOLIN HFA 108 (90 Base) MCG/ACT inhaler INHALE 2 PUFFS BY MOUTH EVERY 4 TO 6 HOURS AS NEEDED FOR COUGH AND FOR WHEEZING 06/18/23   Raliegh Ip, DO    Family History Family History  Problem Relation Age of Onset   Hypertension Father    Eczema Mother    Allergic rhinitis Mother    Angioedema Neg Hx    Asthma Neg Hx    Immunodeficiency Neg Hx    Urticaria Neg Hx     Social History Social History   Tobacco Use   Smoking status: Never   Smokeless tobacco: Never  Vaping Use   Vaping status: Never Used   Substance Use Topics   Alcohol use: Yes   Drug use: No     Allergies   Peanut-containing drug products and Septra [sulfamethoxazole-trimethoprim]   Review of Systems Review of Systems Per HPI  Physical Exam Triage Vital Signs ED Triage Vitals [07/21/23 0827]  Encounter Vitals Group     BP 127/84     Systolic BP Percentile      Diastolic BP Percentile      Pulse Rate (!) 108     Resp 18     Temp 98.1 F (36.7 C)     Temp Source Oral     SpO2 97 %     Weight      Height      Head Circumference      Peak Flow      Pain Score 10     Pain Loc      Pain Education      Exclude from Growth Chart    No data found.  Updated Vital Signs BP 127/84 (BP Location: Right Arm)   Pulse (!) 108   Temp 98.1 F (36.7 C) (Oral)   Resp 18   LMP 07/17/2023 (Exact Date)   SpO2 97%   Visual Acuity Right Eye Distance:   Left Eye Distance:   Bilateral Distance:    Right Eye Near:   Left Eye Near:    Bilateral Near:     Physical Exam Vitals and nursing note reviewed.  Constitutional:      Appearance: Normal appearance.  HENT:     Head: Atraumatic.     Right Ear: Tympanic membrane and external ear normal.     Left Ear: Tympanic membrane and external ear normal.     Nose: Nose normal.     Mouth/Throat:     Mouth: Mucous membranes are moist.     Pharynx: Oropharyngeal exudate and posterior oropharyngeal erythema present.     Comments: Significant bilateral tonsillar erythema, edema, exudates.  Uvula midline, oral airway patent Eyes:     Extraocular Movements: Extraocular movements intact.     Conjunctiva/sclera: Conjunctivae normal.  Cardiovascular:     Rate and Rhythm: Normal rate and regular rhythm.     Heart sounds: Normal heart sounds.  Pulmonary:     Effort: Pulmonary effort is normal.     Breath sounds: Normal breath sounds. No wheezing.  Musculoskeletal:        General: Normal range of motion.     Cervical back: Normal range of motion and neck supple.  Lymphadenopathy:     Cervical: Cervical adenopathy present.  Skin:    General: Skin is warm and dry.  Neurological:     Mental Status: She is alert and oriented to person, place, and time.  Psychiatric:        Mood and Affect: Mood normal.        Thought Content: Thought content normal.      UC Treatments / Results  Labs (all labs ordered are listed, but only abnormal results are displayed) Labs Reviewed - No data to display  EKG   Radiology No results found.  Procedures Procedures (including critical care time)  Medications Ordered in UC Medications - No data to display  Initial Impression / Assessment and Plan / UC Course  I have reviewed the triage vital signs and the nursing notes.  Pertinent labs & imaging results that were available during my care of the patient were reviewed by me and considered in my medical decision making (see chart for details).     Tachycardic in triage, otherwise vital signs within normal limits.  Exam very concerning for bacterial tonsillitis, culture is still pending but given worsening symptoms and exam findings will cover for strep with amoxicillin.  Discussed supportive over-the-counter medications and home care.  Return for worsening symptoms.  Final Clinical Impressions(s) / UC Diagnoses   Final diagnoses:  Strep tonsillitis     Discharge Instructions      Take the full course of antibiotics until completed.  Change her toothbrush after about 2 days on the medication so you do not reinfect yourself.    ED Prescriptions     Medication Sig Dispense Auth. Provider   amoxicillin (AMOXIL) 875 MG tablet Take 1 tablet (875 mg total) by mouth 2 (two) times daily. 20 tablet Particia Nearing, [place]      PDMP not reviewed this encounter.   Particia Nearing, [place] 07/21/23 0900

## 2023-07-21 NOTE — Discharge Instructions (Signed)
Take the full course of antibiotics until completed.  Change her toothbrush after about 2 days on the medication so you do not reinfect yourself.

## 2023-07-21 NOTE — ED Triage Notes (Signed)
Pt reports her throat is really painful x 3 days. She says she has right side ear pain, swollen tonsils, and tonsil stones.    Tried taking the lidocaine prescribed on 10/31 but no relief. Tylenol no relief

## 2023-07-22 LAB — CULTURE, GROUP A STREP (THRC)

## 2023-07-25 ENCOUNTER — Encounter: Payer: Self-pay | Admitting: Family Medicine

## 2023-07-25 ENCOUNTER — Ambulatory Visit (INDEPENDENT_AMBULATORY_CARE_PROVIDER_SITE_OTHER): Payer: 59 | Admitting: Family Medicine

## 2023-07-25 VITALS — BP 110/72 | HR 75 | Temp 98.1°F | Ht 61.0 in | Wt 209.1 lb

## 2023-07-25 DIAGNOSIS — J351 Hypertrophy of tonsils: Secondary | ICD-10-CM

## 2023-07-25 DIAGNOSIS — J324 Chronic pansinusitis: Secondary | ICD-10-CM

## 2023-07-25 DIAGNOSIS — R0683 Snoring: Secondary | ICD-10-CM

## 2023-07-25 DIAGNOSIS — R040 Epistaxis: Secondary | ICD-10-CM

## 2023-07-25 NOTE — Progress Notes (Signed)
Acute Office Visit  Subjective:     Patient ID: Casey Weaver, female    DOB: [birth year], 22 y.o.   MRN: [id number]  Chief Complaint  Patient presents with   Epistaxis    Epistaxis    Patient is in today to discuss referral to ENT. Reports chronic enlarged tonsils for years. She does snore at night. Occasional sore throat. Hx of tonsil lesion that was removed in 2021. No tumor noted- just inflammation. Chronic daily cough, congestion, runny nose. Has nosebleeds a few times a month. Occurs in both nostrils. Has lasted up to an hour before. She uses flonase and zyrtec daily. Also on singular. Currently on medication for strep for treatment of strep. Started on 07/21/23, improving symptoms. No fever.  Review of Systems  HENT:  Positive for nosebleeds.    As per HPI.      Objective:    BP 110/72   Pulse 75   Temp 98.1 F (36.7 C) (Temporal)   Ht 5\' 1"  (1.549 m)   Wt 209 lb 2 oz (94.9 kg)   LMP 07/17/2023 (Exact Date)   SpO2 95%   BMI 39.51 kg/m    Physical Exam Vitals and nursing note reviewed.  Constitutional:      General: She is not in acute distress.    Appearance: She is not ill-appearing, toxic-appearing or diaphoretic.  HENT:     Right Ear: Ear canal and external ear normal. A middle ear effusion is present. Tympanic membrane is not perforated, erythematous, retracted or bulging.     Left Ear: Ear canal and external ear normal. A middle ear effusion is present. Tympanic membrane is not perforated, erythematous, retracted or bulging.     Nose: Congestion present. No mucosal edema.     Right Nostril: No foreign body, epistaxis or occlusion.     Left Nostril: No foreign body, epistaxis or occlusion.     Mouth/Throat:     Lips: Pink.     Mouth: Mucous membranes are moist.     Pharynx: Oropharynx is clear. Posterior oropharyngeal erythema present. No oropharyngeal exudate.     Tonsils: No tonsillar exudate. 1+ on the right. 2+ on the left.  Eyes:     General:         Right eye: No discharge.        Left eye: No discharge.     Conjunctiva/sclera: Conjunctivae normal.  Cardiovascular:     Rate and Rhythm: Normal rate and regular rhythm.     Heart sounds: Normal heart sounds. No murmur heard. Pulmonary:     Effort: Pulmonary effort is normal. No respiratory distress.     Breath sounds: Normal breath sounds. No wheezing or rhonchi.  Musculoskeletal:     Cervical back: Neck supple. No rigidity.  Lymphadenopathy:     Cervical: No cervical adenopathy.  Skin:    General: Skin is warm and dry.  Neurological:     General: No focal deficit present.     Mental Status: She is alert and oriented to person, place, and time.  Psychiatric:        Mood and Affect: Mood normal.        Behavior: Behavior normal.     No results found for any visits on 07/25/23.      Assessment & Plan:   Searra was seen today for epistaxis.  Diagnoses and all orders for this visit:  Recurrent epistaxis -     Ambulatory referral to ENT  Chronic pansinusitis -  Ambulatory referral to ENT  Enlarged tonsils -     Ambulatory referral to ENT  Snoring -     Ambulatory referral to ENT   Continue xyzal, flonase, singular. Referral to ENT placed. Handout given for epistaxis.   Follow up as needed.   The patient indicates understanding of these issues and agrees with the plan.  Gabriel Earing, FNP

## 2023-07-25 NOTE — Patient Instructions (Signed)
Nosebleed, Adult A nosebleed is when blood comes out of the nose. Nosebleeds are common. Usually, they are not a sign of a serious condition. Nosebleeds can happen if a blood vessel in your nose starts to bleed or if the lining of your nose (mucous membrane) cracks. They are commonly caused by: Allergies. Colds. Picking your nose. Blowing your nose too hard. An injury from sticking an object into your nose or getting hit in the nose. Dry or cold air. Less common causes of nosebleeds include: Toxic fumes. Something abnormal in the nose or in the air-filled spaces in the bones of the face (sinuses). Growths in the nose, such as polyps. Blood thinners or conditions that cause blood to clot slowly. Certain illnesses or procedures that irritate or dry out the nasal passages. Follow these instructions at home: When you have a nosebleed:  Sit down and tilt your head slightly forward. Use a clean towel or tissue to pinch your nostrils under the bony part of your nose. After 5 minutes, let go of your nose and see if bleeding starts again. Do not release pressure before that time. If there is still bleeding, repeat the pinching and holding for 5 minutes or until the bleeding stops. Do not place tissues or gauze in the nose to stop the bleeding. Avoid lying down and avoid tilting your head backward. That may make blood collect in the throat and cause gagging or coughing. Use a nasal spray decongestant to help with a nosebleed as told by your health care provider. After a nosebleed: Avoid blowing your nose or sniffing for a number of hours. Avoid straining, lifting, or bending at the waist for several days. You may go back to other normal activities as you are able. If you are taking aspirin or blood thinners and you have nosebleeds, talk to your health care provider. These medicines make bleeding more likely. Ask your health care provider if you should stop taking the medicines or if you should  adjust the dose. Do not stop taking medicines that your health care provider has recommended unless he or she tells you to stop taking them. If your nosebleed was caused by dry mucous membranes, use over-the-counter saline nasal spray or gel and a humidifier as told by your health care provider. This will keep the mucous membranes moist and allow them to heal. If you need to use one of these products: Choose one that is water-soluble. Use only as much as you need and use it only as often as needed. Do not lie down right after you use it. If you get nosebleeds often, talk with your health care provider about medical treatments. Options may include: Nasal cautery. This treatment stops and prevents nosebleeds by using a chemical swab or electrical device to lightly burn tiny blood vessels inside the nose. Nasal packing. A gauze or other material is placed in the nose to keep constant pressure on the bleeding area. Contact a health care provider if you: Have a fever. Get nosebleeds often or more often than usual. Bruise very easily. Have a nosebleed from having something stuck in your nose. Have bleeding in your mouth. Vomit or cough up brown material. Have a nosebleed after you start a new medicine. Get help right away if: You have a nosebleed after a fall or a head injury. Your nosebleed does not go away after 20 minutes. You feel dizzy or weak. You have unusual bleeding from other parts of your body. You have unusual bruising on   other parts of your body. You become sweaty. You vomit blood. Summary A nosebleed is when blood comes out of the nose. Common causes include allergies, an injury to the nose, or cold or dry air. Initial treatment includes applying pressure for 5 minutes. Moisturizing the nose with saline nasal spray or gel after a nosebleed may help prevent future bleeding. Get help right away if your nosebleed does not go away after 20 minutes. This information is not intended  to replace advice given to you by your health care provider. Make sure you discuss any questions you have with your health care provider. Document Revised: 07/03/2019 Document Reviewed: 07/03/2019 Elsevier Patient Education  2022 Elsevier Inc.  

## 2023-07-30 ENCOUNTER — Encounter (INDEPENDENT_AMBULATORY_CARE_PROVIDER_SITE_OTHER): Payer: Self-pay | Admitting: Otolaryngology

## 2023-08-27 ENCOUNTER — Ambulatory Visit (INDEPENDENT_AMBULATORY_CARE_PROVIDER_SITE_OTHER): Payer: 59 | Admitting: Otolaryngology

## 2023-08-27 ENCOUNTER — Encounter (INDEPENDENT_AMBULATORY_CARE_PROVIDER_SITE_OTHER): Payer: Self-pay

## 2023-08-27 VITALS — Ht 60.0 in | Wt 209.0 lb

## 2023-08-27 DIAGNOSIS — J358 Other chronic diseases of tonsils and adenoids: Secondary | ICD-10-CM

## 2023-08-27 DIAGNOSIS — J0391 Acute recurrent tonsillitis, unspecified: Secondary | ICD-10-CM

## 2023-08-27 DIAGNOSIS — R040 Epistaxis: Secondary | ICD-10-CM

## 2023-08-27 MED ORDER — AYR SALINE NASAL NA GEL: 1.0000 | NASAL | 5 refills | Status: AC | PRN
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
## 2023-09-03 ENCOUNTER — Other Ambulatory Visit: Payer: Self-pay | Admitting: Family Medicine

## 2023-09-03 DIAGNOSIS — J4520 Mild intermittent asthma, uncomplicated: Secondary | ICD-10-CM

## 2023-09-20 ENCOUNTER — Telehealth: Payer: Self-pay

## 2023-09-20 MED ORDER — ALBUTEROL SULFATE HFA 108 (90 BASE) MCG/ACT IN AERS: 2.0000 | INHALATION_SPRAY | Freq: Four times a day (QID) | RESPIRATORY_TRACT | 1 refills | Status: DC | PRN

## 2023-09-20 NOTE — Telephone Encounter (Signed)
 Patient's insurance will only pay for generic Ventolin. Please submit new prescription.

## 2023-09-20 NOTE — Telephone Encounter (Signed)
 Sent generic Ventolin for the patient

## 2023-10-15 ENCOUNTER — Other Ambulatory Visit (HOSPITAL_COMMUNITY): Payer: Self-pay

## 2023-12-11 ENCOUNTER — Encounter: Payer: Self-pay | Admitting: Family Medicine

## 2023-12-11 ENCOUNTER — Other Ambulatory Visit (HOSPITAL_COMMUNITY)
Admission: RE | Admit: 2023-12-11 | Discharge: 2023-12-11 | Disposition: A | Discharge status: Home / Self Care | Source: Ambulatory Visit | Attending: Family Medicine | Admitting: Family Medicine

## 2023-12-11 ENCOUNTER — Ambulatory Visit (INDEPENDENT_AMBULATORY_CARE_PROVIDER_SITE_OTHER): Payer: 59 | Admitting: Family Medicine

## 2023-12-11 VITALS — BP 122/80 | HR 94 | Temp 98.4°F | Ht 61.0 in | Wt 212.8 lb

## 2023-12-11 DIAGNOSIS — F410 Panic disorder [episodic paroxysmal anxiety] without agoraphobia: Secondary | ICD-10-CM

## 2023-12-11 DIAGNOSIS — Z0001 Encounter for general adult medical examination with abnormal findings: Secondary | ICD-10-CM

## 2023-12-11 DIAGNOSIS — Z1159 Encounter for screening for other viral diseases: Secondary | ICD-10-CM

## 2023-12-11 DIAGNOSIS — F411 Generalized anxiety disorder: Secondary | ICD-10-CM | POA: Diagnosis not present

## 2023-12-11 DIAGNOSIS — Z124 Encounter for screening for malignant neoplasm of cervix: Secondary | ICD-10-CM | POA: Diagnosis not present

## 2023-12-11 DIAGNOSIS — Z7251 High risk heterosexual behavior: Secondary | ICD-10-CM | POA: Diagnosis not present

## 2023-12-11 DIAGNOSIS — Z114 Encounter for screening for human immunodeficiency virus [HIV]: Secondary | ICD-10-CM

## 2023-12-11 DIAGNOSIS — Z0000 Encounter for general adult medical examination without abnormal findings: Secondary | ICD-10-CM

## 2023-12-11 DIAGNOSIS — Z3009 Encounter for other general counseling and advice on contraception: Secondary | ICD-10-CM | POA: Diagnosis not present

## 2023-12-11 DIAGNOSIS — Z6839 Body mass index (BMI) 39.0-39.9, adult: Secondary | ICD-10-CM

## 2023-12-11 DIAGNOSIS — J4520 Mild intermittent asthma, uncomplicated: Secondary | ICD-10-CM

## 2023-12-11 LAB — CMP14+EGFR
ALT: 14 IU/L (ref 0–32)
AST: 18 IU/L (ref 0–40)
Albumin: 4.5 g/dL (ref 4.0–5.0)
Alkaline Phosphatase: 81 IU/L (ref 44–121)
BUN/Creatinine Ratio: 16 (ref 9–23)
BUN: 10 mg/dL (ref 6–20)
Bilirubin Total: 0.3 mg/dL (ref 0.0–1.2)
CO2: 21 mmol/L (ref 20–29)
Calcium: 9 mg/dL (ref 8.7–10.2)
Chloride: 103 mmol/L (ref 96–106)
Creatinine, Ser: 0.63 mg/dL (ref 0.57–1.00)
Globulin, Total: 2.4 g/dL (ref 1.5–4.5)
Glucose: 87 mg/dL (ref 70–99)
Potassium: 4.5 mmol/L (ref 3.5–5.2)
Sodium: 138 mmol/L (ref 134–144)
Total Protein: 6.9 g/dL (ref 6.0–8.5)
eGFR: 127 mL/min/{1.73_m2} (ref >59)

## 2023-12-11 LAB — LIPID PANEL

## 2023-12-11 LAB — PREGNANCY, URINE: Preg Test, Ur: NEGATIVE

## 2023-12-11 LAB — BAYER DCA HB A1C WAIVED: HB A1C (BAYER DCA - WAIVED): 4.7 % — ABNORMAL LOW (ref 4.8–5.6)

## 2023-12-11 MED ORDER — NORGESTIMATE-ETH ESTRADIOL 0.25-35 MG-MCG PO TABS: 1.0000 | ORAL_TABLET | Freq: Every day | ORAL | 1 refills | Status: DC
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
## 2023-12-11 NOTE — Patient Instructions (Addendum)
You had labs performed today.  You will be contacted with the results of the labs once they are available, usually in the next 3 business days for routine lab work.  If you have an active my chart account, they will be released to your MyChart.  If you prefer to have these labs released to you via telephone, please let us know.     

## 2023-12-12 ENCOUNTER — Other Ambulatory Visit: Payer: Self-pay | Admitting: Family Medicine

## 2023-12-12 DIAGNOSIS — E559 Vitamin D deficiency, unspecified: Secondary | ICD-10-CM

## 2023-12-12 LAB — LIPID PANEL
Cholesterol, Total: 161 mg/dL (ref 100–199)
HDL: 61 mg/dL (ref >39)
LDL CALC COMMENT:: 2.6 ratio (ref 0.0–4.4)
LDL Chol Calc (NIH): 84 mg/dL (ref 0–99)
Triglycerides: 83 mg/dL (ref 0–149)
VLDL Cholesterol Cal: 16 mg/dL (ref 5–40)

## 2023-12-12 LAB — CYTOLOGY - PAP
Adequacy: ABSENT
Chlamydia: NEGATIVE
Comment: NEGATIVE
Comment: NEGATIVE
Comment: NORMAL
Diagnosis: NEGATIVE
Neisseria Gonorrhea: NEGATIVE
Trichomonas: NEGATIVE

## 2023-12-12 LAB — GC/CHLAMYDIA PROBE AMP (~~LOC~~) NOT AT ARMC
Chlamydia: NEGATIVE
Comment: NEGATIVE
Comment: NORMAL
Neisseria Gonorrhea: NEGATIVE

## 2023-12-12 LAB — CBC
Hematocrit: 39.8 % (ref 34.0–46.6)
Hemoglobin: 13.1 g/dL (ref 11.1–15.9)
MCH: 29.4 pg (ref 26.6–33.0)
MCHC: 32.9 g/dL (ref 31.5–35.7)
MCV: 89 fL (ref 79–97)
Platelets: 353 10*3/uL (ref 150–450)
RBC: 4.45 x10E6/uL (ref 3.77–5.28)
RDW: 13.1 % (ref 11.7–15.4)
WBC: 8 10*3/uL (ref 3.4–10.8)

## 2023-12-12 LAB — HEPATITIS C ANTIBODY

## 2023-12-12 LAB — VITAMIN D 25 HYDROXY (VIT D DEFICIENCY, FRACTURES): Vit D, 25-Hydroxy: 22.2 ng/mL — ABNORMAL LOW (ref 30.0–100.0)

## 2023-12-12 LAB — TSH+FREE T4
Free T4: 1.1 ng/dL (ref 0.82–1.77)
TSH: 1.1 u[IU]/mL (ref 0.450–4.500)

## 2023-12-12 LAB — HIV ANTIBODY (ROUTINE TESTING W REFLEX)

## 2023-12-12 MED ORDER — VITAMIN D (ERGOCALCIFEROL) 1.25 MG (50000 UNIT) PO CAPS: 50000.0000 [IU] | ORAL_CAPSULE | ORAL | 0 refills | Status: DC

## 2024-01-03 ENCOUNTER — Other Ambulatory Visit (HOSPITAL_COMMUNITY): Payer: Self-pay

## 2024-03-01 ENCOUNTER — Other Ambulatory Visit: Payer: Self-pay | Admitting: Family Medicine

## 2024-03-01 DIAGNOSIS — J4520 Mild intermittent asthma, uncomplicated: Secondary | ICD-10-CM

## 2024-03-10 ENCOUNTER — Other Ambulatory Visit (HOSPITAL_COMMUNITY): Payer: Self-pay

## 2024-03-11 ENCOUNTER — Other Ambulatory Visit (HOSPITAL_COMMUNITY): Payer: Self-pay

## 2024-03-12 ENCOUNTER — Ambulatory Visit (INDEPENDENT_AMBULATORY_CARE_PROVIDER_SITE_OTHER): Admitting: Family Medicine

## 2024-03-12 ENCOUNTER — Encounter: Payer: Self-pay | Admitting: Family Medicine

## 2024-03-12 VITALS — BP 113/73 | HR 80 | Temp 98.2°F | Ht 61.0 in | Wt 215.0 lb

## 2024-03-12 DIAGNOSIS — F410 Panic disorder [episodic paroxysmal anxiety] without agoraphobia: Secondary | ICD-10-CM

## 2024-03-12 DIAGNOSIS — F411 Generalized anxiety disorder: Secondary | ICD-10-CM | POA: Diagnosis not present

## 2024-03-12 DIAGNOSIS — Z3041 Encounter for surveillance of contraceptive pills: Secondary | ICD-10-CM | POA: Diagnosis not present

## 2024-03-12 MED ORDER — HYDROXYZINE HCL 25 MG PO TABS: 12.5000 mg | ORAL_TABLET | Freq: Three times a day (TID) | ORAL | 3 refills | Status: AC | PRN

## 2024-03-12 MED ORDER — CITALOPRAM HYDROBROMIDE 20 MG PO TABS: 20.0000 mg | ORAL_TABLET | Freq: Every day | ORAL | 3 refills | Status: DC

## 2024-03-12 MED ORDER — NORGESTIMATE-ETH ESTRADIOL 0.25-35 MG-MCG PO TABS: 1.0000 | ORAL_TABLET | Freq: Every day | ORAL | 1 refills | Status: DC

## 2024-03-12 NOTE — Progress Notes (Signed)
 Subjective: CC: OCP PCP: Jolinda Casey HERO, DO YEP:Casey Weaver is a 23 y.o. female presenting to clinic today for:  1.  Contraception She reports the OCP is working out really well.  Her menstrual cycles have really become lighter and regulated.  She is not currently sexually active.  Has not had any concerning features with this OCP  2.  Anxiety disorder with panic attack Patient reports that the Pristiq  caused her to feel weird so she discontinued the medication.  She utilizes up to 1 tablet of Atarax  as needed but she still has difficulty with anxiety and with difficulty sleeping.  Prior to Pristiq  was treated with Lexapro  but that made her feel numb.  However, she notes she is under so much stress at work that she would welcome numbness at this point.  Would like to start a different med for control of symptoms   ROS: Per HPI  Allergies  Allergen Reactions   Peanut -Containing Drug Products Swelling    PEANUT  BUTTER: throat swells   Septra [Sulfamethoxazole-Trimethoprim] Rash   Past Medical History:  Diagnosis Date   Allergic rhinoconjunctivitis    Asthma    Attention deficit disorder (ADD)    Eczema    Food allergy      Current Outpatient Medications:    albuterol  (PROVENTIL ) (2.5 MG/3ML) 0.083% nebulizer solution, INHALE 1 VIAL VIA NEBULIZER EVERY 6 HOURS, Disp: 375 mL, Rfl: 1   amoxicillin  (AMOXIL ) 875 MG tablet, Take 1 tablet (875 mg total) by mouth 2 (two) times daily., Disp: 20 tablet, Rfl: 0   budesonide -formoterol  (SYMBICORT ) 160-4.5 MCG/ACT inhaler, Inhale 2 puffs into the lungs 2 (two) times daily., Disp: 1 each, Rfl: 12   desvenlafaxine  (PRISTIQ ) 50 MG 24 hr tablet, Take 1 tablet (50 mg total) by mouth daily., Disp: 90 tablet, Rfl: 3   EPINEPHrine  (AUVI-Q ) 0.3 mg/0.3 mL IJ SOAJ injection, Use as directed for severe allergic reaction PUT ON FILE, Disp: 2 each, Rfl: 1   hydrOXYzine  (ATARAX ) 25 MG tablet, Take 0.5-2 tablets (12.5-50 mg total) by mouth every 8  (eight) hours as needed (sleep/ anxiety/ panic)., Disp: 90 tablet, Rfl: 3   levocetirizine (XYZAL ) 5 MG tablet, Take 1 tablet (5 mg total) by mouth every evening., Disp: 90 tablet, Rfl: 1   montelukast  (SINGULAIR ) 10 MG tablet, Take 1 tablet (10 mg total) by mouth every evening., Disp: 90 tablet, Rfl: 3   norgestimate -ethinyl estradiol  (ORTHO-CYCLEN) 0.25-35 MG-MCG tablet, Take 1 tablet by mouth daily., Disp: 84 tablet, Rfl: 1   omeprazole  (PRILOSEC) 20 MG capsule, Take 1 capsule (20 mg total) by mouth daily., Disp: 90 capsule, Rfl: 3   saline (AYR) GEL, Place 1 Application into both nostrils every 4 (four) hours as needed., Disp: 14 g, Rfl: 5   triamcinolone  cream (KENALOG ) 0.1 %, Use 1 application twice a day as needed to red itchy areas. Do not use on face, neck, groin, or armpit region, Disp: 453.6 g, Rfl: 0   VENTOLIN  HFA 108 (90 Base) MCG/ACT inhaler, INHALE 2 PUFFS BY MOUTH EVERY 4 TO 6 HOURS AS NEEDED FOR COUGH AND FOR WHEEZING, Disp: 18 g, Rfl: 0   Vitamin D , Ergocalciferol , (DRISDOL ) 1.25 MG (50000 UNIT) CAPS capsule, Take 1 capsule (50,000 Units total) by mouth every 7 (seven) days. X8 weeks. Then take 400 international units  daily over the counter., Disp: 8 capsule, Rfl: 0 Social History   Socioeconomic History   Marital status: Single    Spouse name: Not on file  Number of children: Not on file   Years of education: Not on file   Highest education level: Not on file  Occupational History   Not on file  Tobacco Use   Smoking status: Never   Smokeless tobacco: Never  Vaping Use   Vaping status: Never Used  Substance and Sexual Activity   Alcohol use: Yes   Drug use: No   Sexual activity: Yes    Birth control/protection: None  Other Topics Concern   Not on file  Social History Narrative   Not on file   Social Drivers of Health   Financial Resource Strain: Low Risk  (12/11/2023)   Overall Financial Resource Strain (CARDIA)    Difficulty of Paying Living Expenses: Not  very hard  Food Insecurity: No Food Insecurity (12/11/2023)   Hunger Vital Sign    Worried About Running Out of Food in the Last Year: Never true    Ran Out of Food in the Last Year: Never true  Transportation Needs: No Transportation Needs (12/11/2023)   PRAPARE - Administrator, Civil Service (Medical): No    Lack of Transportation (Non-Medical): No  Physical Activity: Sufficiently Active (12/11/2023)   Exercise Vital Sign    Days of Exercise per Week: 5 days    Minutes of Exercise per Session: 40 min  Stress: No Stress Concern Present (12/11/2023)   [employer] of Occupational Health - Occupational Stress Questionnaire    Feeling of Stress : Not at all  Social Connections: Socially Isolated (12/11/2023)   Social Connection and Isolation Panel    Frequency of Communication with Friends and Family: More than three times a week    Frequency of Social Gatherings with Friends and Family: More than three times a week    Attends Religious Services: Never    Database administrator or Organizations: No    Attends Banker Meetings: Never    Marital Status: Never married  Intimate Partner Violence: Not At Risk (04/28/2021)   Humiliation, Afraid, Rape, and Kick questionnaire    Fear of Current or Ex-Partner: No    Emotionally Abused: No    Physically Abused: No    Sexually Abused: No   Family History  Problem Relation Age of Onset   Hypertension Father    Eczema Mother    Allergic rhinitis Mother    Angioedema Neg Hx    Asthma Neg Hx    Immunodeficiency Neg Hx    Urticaria Neg Hx     Objective: Office vital signs reviewed. BP 113/73   Pulse 80   Temp 98.2 F (36.8 C)   Ht 5' 1 (1.549 m)   Wt 215 lb (97.5 kg)   LMP 02/23/2024   SpO2 98%   BMI 40.62 kg/m   Physical Examination:  General: Awake, alert, well nourished, No acute distress HEENT: sclera white, MMM Cardio: regular rate and rhythm, S1S2 heard, no murmurs appreciated Pulm: clear to  auscultation bilaterally, no wheezes, rhonchi or rales; normal work of breathing on room air     03/12/2024    9:30 AM 03/12/2024    9:27 AM 12/11/2023    9:05 AM  Depression screen PHQ 2/9  Decreased Interest 0 0 0  Down, Depressed, Hopeless 0 0 0  PHQ - 2 Score 0 0 0  Altered sleeping 1 0 0  Tired, decreased energy 1 0 0  Change in appetite 0 0 0  Feeling bad or failure about yourself  0 0  0  Trouble concentrating 0 0 0  Moving slowly or fidgety/restless 0 0 0  Suicidal thoughts 0 0 0  PHQ-9 Score 2 0 0  Difficult doing work/chores Somewhat difficult Not difficult at all Not difficult at all      03/12/2024    9:30 AM 03/12/2024    9:27 AM 12/11/2023    9:05 AM 07/25/2023    1:08 PM  GAD 7 : Generalized Anxiety Score  Nervous, Anxious, on Edge 1 0 0 0  Control/stop worrying 1 0 0 0  Worry too much - different things 1 0 0 0  Trouble relaxing 1 0 0 0  Restless 0 0 0 0  Easily annoyed or irritable 1 0 0 0  Afraid - awful might happen 0 0 0 0  Total GAD 7 Score 5 0 0 0  Anxiety Difficulty  Not difficult at all Not difficult at all Not difficult at all      Assessment/ Plan: 23 y.o. female   Surveillance for birth control, oral contraceptives - Plan: norgestimate -ethinyl estradiol  (ORTHO-CYCLEN) 0.25-35 MG-MCG tablet  Generalized anxiety disorder with panic attacks - Plan: citalopram (CELEXA) 20 MG tablet, hydrOXYzine  (ATARAX ) 25 MG tablet  OCP working well.  It has been renewed.  Anxiety is not well-controlled.  Add Celexa 20 mg.  Atarax  renewed and instructed that she may use up to 2 tablets as needed.  Would like to see her back in 4 to 6 weeks, sooner if concerns arise   Casey CHRISTELLA Fielding, DO [facility] [phone]

## 2024-04-28 ENCOUNTER — Telehealth (INDEPENDENT_AMBULATORY_CARE_PROVIDER_SITE_OTHER): Admitting: Family Medicine

## 2024-04-28 ENCOUNTER — Encounter: Payer: Self-pay | Admitting: Family Medicine

## 2024-04-28 DIAGNOSIS — F411 Generalized anxiety disorder: Secondary | ICD-10-CM

## 2024-04-28 DIAGNOSIS — F410 Panic disorder [episodic paroxysmal anxiety] without agoraphobia: Secondary | ICD-10-CM

## 2024-04-28 DIAGNOSIS — J4520 Mild intermittent asthma, uncomplicated: Secondary | ICD-10-CM | POA: Diagnosis not present

## 2024-04-28 DIAGNOSIS — M7989 Other specified soft tissue disorders: Secondary | ICD-10-CM

## 2024-04-28 DIAGNOSIS — L2084 Intrinsic (allergic) eczema: Secondary | ICD-10-CM | POA: Diagnosis not present

## 2024-04-28 DIAGNOSIS — Z3041 Encounter for surveillance of contraceptive pills: Secondary | ICD-10-CM

## 2024-04-28 MED ORDER — NORGESTIMATE-ETH ESTRADIOL 0.25-35 MG-MCG PO TABS: 1.0000 | ORAL_TABLET | Freq: Every day | ORAL | 4 refills | Status: AC

## 2024-04-28 MED ORDER — FLUTICASONE FUROATE-VILANTEROL 100-25 MCG/ACT IN AEPB: 1.0000 | INHALATION_SPRAY | Freq: Every day | RESPIRATORY_TRACT | 11 refills | Status: AC

## 2024-04-28 MED ORDER — MONTELUKAST SODIUM 10 MG PO TABS: 10.0000 mg | ORAL_TABLET | Freq: Every evening | ORAL | 3 refills | Status: AC

## 2024-04-28 MED ORDER — VENTOLIN HFA 108 (90 BASE) MCG/ACT IN AERS: 2.0000 | INHALATION_SPRAY | Freq: Four times a day (QID) | RESPIRATORY_TRACT | 0 refills | Status: DC | PRN

## 2024-04-28 MED ORDER — CITALOPRAM HYDROBROMIDE 20 MG PO TABS: 20.0000 mg | ORAL_TABLET | Freq: Every day | ORAL | 3 refills | Status: AC

## 2024-04-28 MED ORDER — TRIAMCINOLONE ACETONIDE 0.1 % EX CREA: TOPICAL_CREAM | CUTANEOUS | 0 refills | Status: AC

## 2024-04-28 NOTE — Progress Notes (Signed)
 MyChart Video visit  Subjective: CC:f/u OCP/ mood PCP: Jolinda Norene HERO, DO YEP:Casey Weaver is a 23 y.o. female. Patient provides verbal consent for consult held via video.  Due to COVID-19 pandemic this visit was conducted virtually. This visit type was conducted due to national recommendations for restrictions regarding the COVID-19 Pandemic (e.g. social distancing, sheltering in place) in an effort to limit this patient's exposure and mitigate transmission in our community. All issues noted in this document were discussed and addressed.  A physical exam was not performed with this format.   Location of patient: home Location of provider: [facility] Others present for call: none  1.  Birth control She reports birth control is going well.  She has no concerns. Patient's last menstrual period was 04/23/2024.  2.  Anxiety disorder with panic attack She reports Celexa  is really working well.  She had only been on 10 mg for a while but went ahead and escalated to 20 mg and that seems to be working well.  Situational anxiety at work is also gotten better.  She is considering pursuing a position at [employer] in [place] soon  3.  Asthma and allergy  Would like to have a refill on triamcinolone , albuterol .  She has been utilizing her Breyla twice daily but when she uses it regularly like that she actually becomes ill off of it.  She did not have problems with the brand-name Symbicort  like she does this 1.  4.  Soft tissue mass Reports a soft tissue mass on the right anterior thigh that she noted recently.  She actually used one of her mom's massage guns on it and it seem to get smaller.  It does not hurt but she wanted make note of it   ROS: Per HPI  Allergies  Allergen Reactions   Peanut -Containing Drug Products Swelling    PEANUT  BUTTER: throat swells   Septra [Sulfamethoxazole-Trimethoprim] Rash   Past Medical History:  Diagnosis Date   Allergic rhinoconjunctivitis    Asthma    Attention  deficit disorder (ADD)    Eczema    Food allergy      Current Outpatient Medications:    albuterol  (PROVENTIL ) (2.5 MG/3ML) 0.083% nebulizer solution, INHALE 1 VIAL VIA NEBULIZER EVERY 6 HOURS, Disp: 375 mL, Rfl: 1   amoxicillin  (AMOXIL ) 875 MG tablet, Take 1 tablet (875 mg total) by mouth 2 (two) times daily., Disp: 20 tablet, Rfl: 0   budesonide -formoterol  (SYMBICORT ) 160-4.5 MCG/ACT inhaler, Inhale 2 puffs into the lungs 2 (two) times daily., Disp: 1 each, Rfl: 12   citalopram  (CELEXA ) 20 MG tablet, Take 1 tablet (20 mg total) by mouth daily., Disp: 90 tablet, Rfl: 3   EPINEPHrine  (AUVI-Q ) 0.3 mg/0.3 mL IJ SOAJ injection, Use as directed for severe allergic reaction PUT ON FILE, Disp: 2 each, Rfl: 1   hydrOXYzine  (ATARAX ) 25 MG tablet, Take 0.5-2 tablets (12.5-50 mg total) by mouth every 8 (eight) hours as needed (sleep/ anxiety/ panic)., Disp: 90 tablet, Rfl: 3   levocetirizine (XYZAL ) 5 MG tablet, Take 1 tablet (5 mg total) by mouth every evening., Disp: 90 tablet, Rfl: 1   montelukast  (SINGULAIR ) 10 MG tablet, Take 1 tablet (10 mg total) by mouth every evening., Disp: 90 tablet, Rfl: 3   norgestimate -ethinyl estradiol  (ORTHO-CYCLEN) 0.25-35 MG-MCG tablet, Take 1 tablet by mouth daily., Disp: 84 tablet, Rfl: 1   omeprazole  (PRILOSEC) 20 MG capsule, Take 1 capsule (20 mg total) by mouth daily., Disp: 90 capsule, Rfl: 3   saline (AYR) GEL,  Place 1 Application into both nostrils every 4 (four) hours as needed., Disp: 14 g, Rfl: 5   triamcinolone  cream (KENALOG ) 0.1 %, Use 1 application twice a day as needed to red itchy areas. Do not use on face, neck, groin, or armpit region, Disp: 453.6 g, Rfl: 0   VENTOLIN  HFA 108 (90 Base) MCG/ACT inhaler, INHALE 2 PUFFS BY MOUTH EVERY 4 TO 6 HOURS AS NEEDED FOR COUGH AND FOR WHEEZING, Disp: 18 g, Rfl: 0   Vitamin D , Ergocalciferol , (DRISDOL ) 1.25 MG (50000 UNIT) CAPS capsule, Take 1 capsule (50,000 Units total) by mouth every 7 (seven) days. X8 weeks. Then  take 400 international units  daily over the counter., Disp: 8 capsule, Rfl: 0  Gen: well appearing female, NAD Psych: mood stable speech normal, affect appropriate. Skin: No visible rashes or lumps at site of concern  Assessment/ Plan: 23 y.o. female   Surveillance for birth control, oral contraceptives - Plan: norgestimate -ethinyl estradiol  (ORTHO-CYCLEN) 0.25-35 MG-MCG tablet  Intrinsic eczema - Plan: triamcinolone  cream (KENALOG ) 0.1 %, montelukast  (SINGULAIR ) 10 MG tablet  Mild intermittent chronic asthma without complication - Plan: VENTOLIN  HFA 108 (90 Base) MCG/ACT inhaler, montelukast  (SINGULAIR ) 10 MG tablet, fluticasone  furoate-vilanterol (BREO ELLIPTA ) 100-25 MCG/ACT AEPB  Generalized anxiety disorder with panic attacks - Plan: citalopram  (CELEXA ) 20 MG tablet  Mass of soft tissue of thigh  OCP renewed.  Working well.  Trial of Breo over the generic Symbicort  as she is having some feelings of nausea with regular use.  Singulair  and albuterol  inhaler renewed for as needed use  Kenalog  ordered  Celexa  renewed.  Working well at 20 mg  Mass of soft tissue on the thigh may be cystic versus lipoma.  She reports it got smaller after some massage therapy so I do not favor that it is malignant.  We discussed limitation of virtual visit for diagnosis but glad to further evaluate should concerns arise  Start time: 1:52pm End time: 2:01pm  Total time spent on patient care (including video visit/ documentation): 20 minutes  Louella Medaglia CHRISTELLA Fielding, DO [facility] [phone]

## 2024-08-01 ENCOUNTER — Other Ambulatory Visit: Payer: Self-pay | Admitting: *Deleted

## 2024-08-01 DIAGNOSIS — J4520 Mild intermittent asthma, uncomplicated: Secondary | ICD-10-CM

## 2024-12-12 ENCOUNTER — Encounter: Payer: Self-pay | Admitting: Family Medicine
